# Patient Record
Sex: Female | Born: 1945 | Race: White | Hispanic: No | Marital: Married | Smoking: Never smoker
Health system: Southern US, Community
[De-identification: ages and names within clinical notes are randomized; demographics above are authoritative.]

## PROBLEM LIST (undated history)

## (undated) DIAGNOSIS — E785 Hyperlipidemia, unspecified: Secondary | ICD-10-CM

## (undated) DIAGNOSIS — J069 Acute upper respiratory infection, unspecified: Secondary | ICD-10-CM

## (undated) DIAGNOSIS — I10 Essential (primary) hypertension: Secondary | ICD-10-CM

## (undated) HISTORY — PX: TUBAL LIGATION: SHX77

## (undated) HISTORY — DX: Essential (primary) hypertension: I10

## (undated) HISTORY — PX: COLPOSCOPY: SHX161

## (undated) HISTORY — DX: Hyperlipidemia, unspecified: E78.5

---

## 1999-02-15 ENCOUNTER — Other Ambulatory Visit: Admission: RE | Admit: 1999-02-15 | Discharge: 1999-02-15 | Payer: Self-pay | Admitting: Family Medicine

## 2000-05-02 ENCOUNTER — Other Ambulatory Visit: Admission: RE | Admit: 2000-05-02 | Discharge: 2000-05-02 | Payer: Self-pay | Admitting: Family Medicine

## 2002-10-23 ENCOUNTER — Other Ambulatory Visit: Admission: RE | Admit: 2002-10-23 | Discharge: 2002-10-23 | Payer: Self-pay | Admitting: Family Medicine

## 2003-12-17 ENCOUNTER — Other Ambulatory Visit: Admission: RE | Admit: 2003-12-17 | Discharge: 2003-12-17 | Payer: Self-pay | Admitting: Family Medicine

## 2005-05-16 ENCOUNTER — Other Ambulatory Visit: Admission: RE | Admit: 2005-05-16 | Discharge: 2005-05-16 | Payer: Self-pay | Admitting: Family Medicine

## 2006-07-03 ENCOUNTER — Other Ambulatory Visit: Admission: RE | Admit: 2006-07-03 | Discharge: 2006-07-03 | Payer: Self-pay | Admitting: Family Medicine

## 2010-08-09 ENCOUNTER — Encounter (INDEPENDENT_AMBULATORY_CARE_PROVIDER_SITE_OTHER): Payer: Self-pay | Admitting: *Deleted

## 2010-08-11 ENCOUNTER — Encounter (INDEPENDENT_AMBULATORY_CARE_PROVIDER_SITE_OTHER): Payer: Self-pay | Admitting: *Deleted

## 2010-09-09 ENCOUNTER — Encounter (INDEPENDENT_AMBULATORY_CARE_PROVIDER_SITE_OTHER): Payer: Self-pay | Admitting: *Deleted

## 2010-09-12 ENCOUNTER — Ambulatory Visit
Admission: RE | Admit: 2010-09-12 | Discharge: 2010-09-12 | Payer: Self-pay | Source: Home / Self Care | Attending: Gastroenterology | Admitting: Gastroenterology

## 2010-09-27 ENCOUNTER — Encounter: Payer: Self-pay | Admitting: Gastroenterology

## 2010-09-27 ENCOUNTER — Ambulatory Visit
Admission: RE | Admit: 2010-09-27 | Discharge: 2010-09-27 | Payer: Self-pay | Source: Home / Self Care | Attending: Gastroenterology | Admitting: Gastroenterology

## 2010-09-29 NOTE — Letter (Signed)
Summary: Pre Visit Letter Revised  [facility]  [address] [place], [place] [address]   Phone: [phone]  Fax: [phone]        08/09/2010 MRN: [id number] Carla Fleming [address] [place], [place]  [address]             Procedure Date:  09-19-10   Welcome to the Gastroenterology Division at [facility].    You are scheduled to see a nurse for your pre-procedure visit on 09-05-10 at 2:00P.M. on the 3rd floor at [facility], [address]  We ask that you try to arrive at our office 15 minutes prior to your appointment time to allow for check-in.  Please take a minute to review the attached form.  If you answer "Yes" to one or more of the questions on the first page, we ask that you call the person listed at your earliest opportunity.  If you answer "No" to all of the questions, please complete the rest of the form and bring it to your appointment.    Your nurse visit will consist of discussing your medical and surgical history, your immediate family medical history, and your medications.   If you are unable to list all of your medications on the form, please bring the medication bottles to your appointment and we will list them.  We will need to be aware of both prescribed and over the counter drugs.  We will need to know exact dosage information as well.    Please be prepared to read and sign documents such as consent forms, a financial agreement, and acknowledgement forms.  If necessary, and with your consent, a friend or relative is welcome to sit-in on the nurse visit with you.  Please bring your insurance card so that we may make a copy of it.  If your insurance requires a referral to see a specialist, please bring your referral form from your primary care physician.  No co-pay is required for this nurse visit.     If you cannot keep your appointment, please call [phone] to cancel or reschedule prior to your appointment date.  This allows [place] the  opportunity to schedule an appointment for another patient in need of care.    Thank you for choosing [facility] for your medical needs.  We appreciate the opportunity to care for you.  Please visit [place] at our website  to learn more about our practice.  Sincerely, The Gastroenterology Division

## 2010-09-29 NOTE — Letter (Signed)
Summary: Pre Visit Letter Revised  [facility]  [address] [place], [place] [address]   Phone: [phone]  Fax: [phone]        08/11/2010 MRN: [id number]   Carla Fleming [address] [place], [place]  [address]   Procedure Date: 09/27/10 @ 10:00 A.M.   Welcome to the Gastroenterology Division at [facility].    You are scheduled to see a nurse for your pre-procedure visit on 09/12/10 at 8:30 A.M. on the 3rd floor at [facility], [address].  We ask that you try to arrive at our office 15 minutes prior to your appointment time to allow for check-in.  Please take a minute to review the attached form.  If you answer "Yes" to one or more of the questions on the first page, we ask that you call the person listed at your earliest opportunity.  If you answer "No" to all of the questions, please complete the rest of the form and bring it to your appointment.    Your nurse visit will consist of discussing your medical and surgical history, your immediate family medical history, and your medications.   If you are unable to list all of your medications on the form, please bring the medication bottles to your appointment and we will list them.  We will need to be aware of both prescribed and over the counter drugs.  We will need to know exact dosage information as well.    Please be prepared to read and sign documents such as consent forms, a financial agreement, and acknowledgement forms.  If necessary, and with your consent, a friend or relative is welcome to sit-in on the nurse visit with you.  Please bring your insurance card so that we may make a copy of it.  If your insurance requires a referral to see a specialist, please bring your referral form from your primary care physician.  No co-pay is required for this nurse visit.     If you cannot keep your appointment, please call [phone] to cancel or reschedule prior to your appointment date.  This allows  [place] the opportunity to schedule an appointment for another patient in need of care.    Thank you for choosing [facility] for your medical needs.  We appreciate the opportunity to care for you.  Please visit [place] at our website  to learn more about our practice.  Sincerely, The Gastroenterology Division

## 2010-09-29 NOTE — Letter (Signed)
Summary: [facility] Instructions  [facility]  [address] [place], [place] [address]   Phone: [phone]  Fax: [phone]       Carla Fleming    [birth year]    MRN: [id number]        Procedure Day Dorna Bloom:  Jake Shark  09/27/10     Arrival Time:  9:00AM     Procedure Time:  10:00AM     Location of Procedure:                    Juliann Pares _  [facility] (4th Floor)                      PREPARATION FOR COLONOSCOPY WITH MOVIPREP   Starting 5 days prior to your procedure 09/22/10 do not eat nuts, seeds, popcorn, corn, beans, peas,  salads, or any raw vegetables.  Do not take any fiber supplements (e.g. Metamucil, Citrucel, and Benefiber).  THE DAY BEFORE YOUR PROCEDURE         DATE: 09/26/10   DAY: MONDAY  1.  Drink clear liquids the entire day-NO SOLID FOOD  2.  Do not drink anything colored red or purple.  Avoid juices with pulp.  No orange juice.  3.  Drink at least 64 oz. (8 glasses) of fluid/clear liquids during the day to prevent dehydration and help the prep work efficiently.  CLEAR LIQUIDS INCLUDE: Water Jello Ice Popsicles Tea (sugar ok, no milk/cream) Powdered fruit flavored drinks Coffee (sugar ok, no milk/cream) Gatorade Juice: apple, white grape, white cranberry  Lemonade Clear bullion, consomm, broth Carbonated beverages (any kind) Strained chicken noodle soup Hard Candy                             4.  In the morning, mix first dose of MoviPrep solution:    Empty 1 Pouch A and 1 Pouch B into the disposable container    Add lukewarm drinking water to the top line of the container. Mix to dissolve    Refrigerate (mixed solution should be used within 24 hrs)  5.  Begin drinking the prep at 5:00 p.m. The MoviPrep container is divided by 4 marks.   Every 15 minutes drink the solution down to the next mark (approximately 8 oz) until the full liter is complete.   6.  Follow completed prep with 16 oz of clear liquid of your choice (Nothing  red or purple).  Continue to drink clear liquids until bedtime.  7.  Before going to bed, mix second dose of MoviPrep solution:    Empty 1 Pouch A and 1 Pouch B into the disposable container    Add lukewarm drinking water to the top line of the container. Mix to dissolve    Refrigerate  THE DAY OF YOUR PROCEDURE      DATE: 09/27/10  DAY: TUESDAY  Beginning at 5:00AM (5 hours before procedure):         1. Every 15 minutes, drink the solution down to the next mark (approx 8 oz) until the full liter is complete.  2. Follow completed prep with 16 oz. of clear liquid of your choice.    3. You may drink clear liquids until 8:00AM (2 HOURS BEFORE PROCEDURE).   MEDICATION INSTRUCTIONS  Unless otherwise instructed, you should take regular prescription medications with a small sip of water   as early as possible the morning of  your procedure.         OTHER INSTRUCTIONS  You will need a responsible adult at least 65 years of age to accompany you and drive you home.   This person must remain in the waiting room during your procedure.  Wear loose fitting clothing that is easily removed.  Leave jewelry and other valuables at home.  However, you may wish to bring a book to read or  an iPod/MP3 player to listen to music as you wait for your procedure to start.  Remove all body piercing jewelry and leave at home.  Total time from sign-in until discharge is approximately 2-3 hours.  You should go home directly after your procedure and rest.  You can resume normal activities the  day after your procedure.  The day of your procedure you should not:   Drive   Make legal decisions   Operate machinery   Drink alcohol   Return to work  You will receive specific instructions about eating, activities and medications before you leave.    The above instructions have been reviewed and explained to me by   Karl Bales RN  September 12, 2010 8:40 AM    I fully understand and  can verbalize these instructions _____________________________ Date _________

## 2010-09-29 NOTE — Miscellaneous (Signed)
Summary: [facility] previsit  Clinical Lists Changes  Medications: Added new medication of MOVIPREP 100 GM  SOLR (PEG-KCL-NACL-NASULF-NA ASC-C) As per prep instructions. - Signed Rx of MOVIPREP 100 GM  SOLR (PEG-KCL-NACL-NASULF-NA ASC-C) As per prep instructions.;  #1 x 0;  Signed;  Entered by: Karl Bales RN;  Authorized by: Louis Meckel MD;  Method used: Print then Give to Patient Observations: Added new observation of NKA: T (09/12/2010 7:53)    Prescriptions: MOVIPREP 100 GM  SOLR (PEG-KCL-NACL-NASULF-NA ASC-C) As per prep instructions.  #1 x 0   Entered by:   Karl Bales RN   Authorized by:   Louis Meckel MD   Signed by:   Karl Bales RN on 09/12/2010   Method used:   Print then Give to Patient   RxID:   [id number]

## 2010-10-05 NOTE — Procedures (Signed)
Summary: Colonoscopy  Patient: Carla Fleming Note: All result statuses are Final unless otherwise noted.  Tests: (1) Colonoscopy (COL)   COL Colonoscopy           DONE     [facility]     [address]     [place], [place]  [address]           COLONOSCOPY PROCEDURE REPORT           PATIENT:  Carla Fleming, Carla Fleming  MR#:  [id number]     BIRTHDATE:  29-Apr-1946, 64 yrs. old  GENDER:  female           ENDOSCOPIST:  Barbette Hair. Arlyce Dice, MD     Referred by:  Wardell Heath, N.P.           PROCEDURE DATE:  09/27/2010     PROCEDURE:  Diagnostic Colonoscopy     ASA CLASS:  Class I     INDICATIONS:  1) Routine Risk Screening           MEDICATIONS:   Fentanyl 75 mcg IV, Versed 8 mg IV, Benadryl 12.5     mg IV           DESCRIPTION OF PROCEDURE:   After the risks benefits and     alternatives of the procedure were thoroughly explained, informed     consent was obtained.  Digital rectal exam was performed and     revealed no abnormalities.   The LB [id number]-[id number] endoscope     was introduced through the anus and advanced to the cecum, which     was identified by both the appendix and ileocecal valve, without     limitations.  The quality of the prep was excellent, using     MoviPrep.  The instrument was then slowly withdrawn as the colon     was fully examined.     <<PROCEDUREIMAGES>>           FINDINGS:  A normal appearing cecum, ileocecal valve, and     appendiceal orifice were identified. The ascending, hepatic     flexure, transverse, splenic flexure, descending, sigmoid colon,     and rectum appeared unremarkable (see image2, image3, image4,     image6, image7, image9, image12, and image13).   Retroflexed views     in the rectum revealed no abnormalities.    The time to cecum =     3.75  minutes. The scope was then withdrawn (time =  7.50  min)     from the patient and the procedure completed.           COMPLICATIONS:  None           ENDOSCOPIC IMPRESSION:     1) Normal  colon     RECOMMENDATIONS:     1) Continue current colorectal screening recommendations for     "routine risk" patients with a repeat colonoscopy in 10 years.           REPEAT EXAM:   10 year(s) Colonoscopy           ______________________________     Barbette Hair. Arlyce Dice, MD           CC:           n.     eSIGNED:   Barbette Hair. Tifanie Gardiner at 09/27/2010 10:09 AM           Berenda Morale, [id number]  Note: An exclamation mark (!) indicates a  result that was not dispersed into the flowsheet. Document Creation Date: 09/27/2010 10:09 AM _______________________________________________________________________  (1) Order result status: Final Collection or observation date-time: 09/27/2010 10:02 Requested date-time:  Receipt date-time:  Reported date-time:  Referring Physician:   Ordering Physician: Melvia Heaps [phone]) Specimen Source:  Source: Launa Grill Order Number: [id number] Lab site:   Appended Document: Colonoscopy    Clinical Lists Changes  Observations: Added new observation of COLONNXTDUE: 08/2020 (09/27/2010 10:57)

## 2011-06-02 ENCOUNTER — Encounter (INDEPENDENT_AMBULATORY_CARE_PROVIDER_SITE_OTHER): Payer: Self-pay | Admitting: Surgery

## 2011-06-05 ENCOUNTER — Ambulatory Visit (INDEPENDENT_AMBULATORY_CARE_PROVIDER_SITE_OTHER): Payer: Self-pay | Admitting: Surgery

## 2011-06-09 ENCOUNTER — Encounter (INDEPENDENT_AMBULATORY_CARE_PROVIDER_SITE_OTHER): Payer: Self-pay | Admitting: Surgery

## 2011-06-09 ENCOUNTER — Ambulatory Visit (INDEPENDENT_AMBULATORY_CARE_PROVIDER_SITE_OTHER): Payer: BC Managed Care – PPO | Admitting: Surgery

## 2011-06-09 VITALS — BP 158/103 | HR 73 | Ht 63.0 in | Wt 157.0 lb

## 2011-06-09 DIAGNOSIS — K8010 Calculus of gallbladder with chronic cholecystitis without obstruction: Secondary | ICD-10-CM | POA: Insufficient documentation

## 2011-06-09 NOTE — Progress Notes (Signed)
Chief Complaint  Patient presents with  . Abdominal Pain    gallbladder    HPI Carla Fleming is a 65 y.o. female.  She presents in referral from Dr. Rudi Fleming for evaluation of gallstones. The patient had a routine chest x-ray 2 years ago and was noted to have gallstones at that time. She was asymptomatic. Over the last several months she has been experiencing some postprandial nausea generalized abdominal pain, heartburn symptoms, and some back pain. She states that this tends to happen after eating greasy meals. She has changed her diet significantly. She has been started on Prilosec which has helped slightly. The patient states that when she has these episodes she tends to have multiple bowel movements progressing towards diarrhea. Normally she has a solid bowel movement every other day. She also has noted some abdominal bloating with these episodes. She was evaluated by Dr. Christell Fleming who obtained an abdominal ultrasound on 05/22/11 at [facility]. This showed multiple gallstones but no gallbladder wall thickening. Common bile duct was normal. She is now referred for surgical evaluation. HPI  Past Medical History  Diagnosis Date  . Hypertension   . Hyperlipidemia     Past Surgical History  Procedure Date  . Colposcopy 30+ years ago    Family History  Problem Relation Age of Onset  . Hypertension Mother     Social History History  Substance Use Topics  . Smoking status: Never Smoker   . Smokeless tobacco: Not on file  . Alcohol Use: Yes     occasionally    No Known Allergies  Current Outpatient Prescriptions  Medication Sig Dispense Refill  . fosinopril-hydrochlorothiazide (MONOPRIL-HCT) 20-12.5 MG per tablet Take 1 tablet by mouth daily.        . pravastatin (PRAVACHOL) 40 MG tablet Take 40 mg by mouth daily.          Review of Systems Review of Systems ROS positive for abdominal pain, bloating, diarrhea, nausea, otherwise negative Blood pressure 158/103, pulse  73, height 5\' 3"  (1.6 m), weight 157 lb (71.215 kg).  Physical Exam Physical Exam WDWN in NAD HEENT:  EOMI, sclera anicteric Neck:  No masses, no thyromegaly Lungs:  CTA bilaterally; normal respiratory effort CV:  Regular rate and rhythm; no murmurs Abd:  +bowel sounds, soft, non-tender, no masses Ext:  Well-perfused; no edema Skin:  Warm, dry; no sign of jaundice  Data Reviewed Abdominal U/S - multiple gallstones, no sign of wall thickening or pericholecystic fluid; nl CBD Assessment    Chronic calculus cholecystitis with biliary colic    Plan    Laparoscopic cholecystectomy with intraoperative cholangiogram.  I discussed the procedure in detail.  The patient was given Agricultural engineer.  We discussed the risks and benefits of a laparoscopic cholecystectomy including, but not limited to bleeding, infection, injury to surrounding structures such as the intestine or liver, bile leak, retained gallstones, need to convert to an open procedure, prolonged diarrhea, blood clots such as  DVT, common bile duct injury, anesthesia risks, and possible need for additional procedures.  The likelihood of improvement in symptoms and return to the patient's normal status is good. We discussed the typical post-operative recovery course.        Carla Fleming K. 06/09/2011, 9:52 AM

## 2011-06-09 NOTE — Patient Instructions (Signed)
Our surgery schedulers will call you to schedule the surgery.  You will need to be off of work for about a week.

## 2011-06-26 ENCOUNTER — Encounter (INDEPENDENT_AMBULATORY_CARE_PROVIDER_SITE_OTHER): Payer: Self-pay

## 2011-06-28 ENCOUNTER — Encounter (HOSPITAL_COMMUNITY): Payer: Self-pay

## 2011-07-03 ENCOUNTER — Encounter (HOSPITAL_COMMUNITY)
Admission: RE | Admit: 2011-07-03 | Discharge: 2011-07-03 | Disposition: A | Discharge status: Home / Self Care | Payer: BC Managed Care – PPO | Source: Ambulatory Visit | Attending: Surgery | Admitting: Surgery

## 2011-07-03 ENCOUNTER — Other Ambulatory Visit: Payer: Self-pay

## 2011-07-03 ENCOUNTER — Encounter (HOSPITAL_COMMUNITY): Payer: Self-pay

## 2011-07-03 HISTORY — DX: Acute upper respiratory infection, unspecified: J06.9

## 2011-07-03 LAB — DIFFERENTIAL
Basophils Relative: 1 % (ref 0–1)
Monocytes Absolute: 0.5 10*3/uL (ref 0.1–1.0)
Monocytes Relative: 7 % (ref 3–12)
Neutro Abs: 4.2 10*3/uL (ref 1.7–7.7)

## 2011-07-03 LAB — CBC
Platelets: 276 10*3/uL (ref 150–400)
RDW: 13.1 % (ref 11.5–15.5)
WBC: 8 10*3/uL (ref 4.0–10.5)

## 2011-07-03 LAB — COMPREHENSIVE METABOLIC PANEL
ALT: 20 U/L (ref 0–35)
AST: 22 U/L (ref 0–37)
Albumin: 4 g/dL (ref 3.5–5.2)
Alkaline Phosphatase: 128 U/L — ABNORMAL HIGH (ref 39–117)
Chloride: 104 mEq/L (ref 96–112)
Creatinine, Ser: 0.67 mg/dL (ref 0.50–1.10)
Potassium: 3.9 mEq/L (ref 3.5–5.1)
Sodium: 142 mEq/L (ref 135–145)
Total Bilirubin: 0.6 mg/dL (ref 0.3–1.2)

## 2011-07-03 MED ORDER — CEFAZOLIN SODIUM 1-5 GM-% IV SOLN
1.0000 g | INTRAVENOUS | Status: DC
  Filled 2011-07-03: qty 50

## 2011-07-03 NOTE — Progress Notes (Signed)
Quick Note:  This patient may proceed with surgery ______ 

## 2011-07-04 ENCOUNTER — Encounter (HOSPITAL_COMMUNITY): Payer: Self-pay | Admitting: *Deleted

## 2011-07-04 ENCOUNTER — Encounter (HOSPITAL_COMMUNITY): Payer: Self-pay | Admitting: Anesthesiology

## 2011-07-04 ENCOUNTER — Ambulatory Visit (HOSPITAL_COMMUNITY)
Admission: RE | Admit: 2011-07-04 | Discharge: 2011-07-04 | Disposition: A | Discharge status: Home / Self Care | Payer: BC Managed Care – PPO | Source: Ambulatory Visit | Attending: Surgery | Admitting: Surgery

## 2011-07-04 ENCOUNTER — Other Ambulatory Visit (HOSPITAL_COMMUNITY): Payer: Self-pay | Admitting: Anesthesiology

## 2011-07-04 ENCOUNTER — Other Ambulatory Visit (INDEPENDENT_AMBULATORY_CARE_PROVIDER_SITE_OTHER): Payer: Self-pay | Admitting: Surgery

## 2011-07-04 ENCOUNTER — Encounter (HOSPITAL_COMMUNITY)
Admission: RE | Disposition: A | Discharge status: Home / Self Care | Payer: Self-pay | Source: Ambulatory Visit | Attending: Surgery

## 2011-07-04 ENCOUNTER — Ambulatory Visit (HOSPITAL_COMMUNITY): Payer: BC Managed Care – PPO | Admitting: Anesthesiology

## 2011-07-04 ENCOUNTER — Ambulatory Visit (HOSPITAL_COMMUNITY): Payer: BC Managed Care – PPO

## 2011-07-04 DIAGNOSIS — Z01812 Encounter for preprocedural laboratory examination: Secondary | ICD-10-CM | POA: Insufficient documentation

## 2011-07-04 DIAGNOSIS — Z01818 Encounter for other preprocedural examination: Secondary | ICD-10-CM | POA: Insufficient documentation

## 2011-07-04 DIAGNOSIS — Z01810 Encounter for preprocedural cardiovascular examination: Secondary | ICD-10-CM | POA: Insufficient documentation

## 2011-07-04 DIAGNOSIS — K8010 Calculus of gallbladder with chronic cholecystitis without obstruction: Secondary | ICD-10-CM | POA: Insufficient documentation

## 2011-07-04 DIAGNOSIS — I10 Essential (primary) hypertension: Secondary | ICD-10-CM | POA: Insufficient documentation

## 2011-07-04 HISTORY — PX: CHOLECYSTECTOMY: SHX55

## 2011-07-04 SURGERY — LAPAROSCOPIC CHOLECYSTECTOMY WITH INTRAOPERATIVE CHOLANGIOGRAM
Anesthesia: General | Site: Abdomen | Wound class: Contaminated

## 2011-07-04 MED ORDER — CEFAZOLIN SODIUM 1-5 GM-% IV SOLN
INTRAVENOUS | Status: DC | PRN
  Administered 2011-07-04: 1 g via INTRAVENOUS

## 2011-07-04 MED ORDER — OXYCODONE-ACETAMINOPHEN 5-325 MG PO TABS
1.0000 | ORAL_TABLET | ORAL | Status: DC | PRN
  Filled 2011-07-04: qty 1

## 2011-07-04 MED ORDER — ONDANSETRON HCL 4 MG/2ML IJ SOLN
INTRAMUSCULAR | Status: DC | PRN
  Administered 2011-07-04: 4 mg via INTRAVENOUS

## 2011-07-04 MED ORDER — HYDROMORPHONE HCL PF 1 MG/ML IJ SOLN
0.2500 mg | INTRAMUSCULAR | Status: DC | PRN
  Administered 2011-07-04 (×2): 0.25 mg via INTRAVENOUS

## 2011-07-04 MED ORDER — LACTATED RINGERS IV SOLN
INTRAVENOUS | Status: DC
  Administered 2011-07-04: 10:00:00 via INTRAVENOUS

## 2011-07-04 MED ORDER — BUPIVACAINE-EPINEPHRINE 0.25% -1:200000 IJ SOLN
INTRAMUSCULAR | Status: DC | PRN
  Administered 2011-07-04: 10 mL

## 2011-07-04 MED ORDER — LACTATED RINGERS IV SOLN
INTRAVENOUS | Status: DC | PRN
  Administered 2011-07-04 (×2): via INTRAVENOUS

## 2011-07-04 MED ORDER — ROCURONIUM BROMIDE 100 MG/10ML IV SOLN
INTRAVENOUS | Status: DC | PRN
  Administered 2011-07-04: 40 mg via INTRAVENOUS

## 2011-07-04 MED ORDER — OXYCODONE-ACETAMINOPHEN 5-325 MG PO TABS: 1.0000 | ORAL_TABLET | ORAL | Status: AC | PRN

## 2011-07-04 MED ORDER — SODIUM CHLORIDE 0.9 % IJ SOLN: 3.0000 mL | INTRAMUSCULAR | Status: DC | PRN

## 2011-07-04 MED ORDER — MIDAZOLAM HCL 5 MG/5ML IJ SOLN
INTRAMUSCULAR | Status: DC | PRN
  Administered 2011-07-04: 2 mg via INTRAVENOUS

## 2011-07-04 MED ORDER — ONDANSETRON HCL 4 MG/2ML IJ SOLN
4.0000 mg | INTRAMUSCULAR | Status: DC | PRN
  Administered 2011-07-04: 4 mg via INTRAVENOUS
  Filled 2011-07-04: qty 2

## 2011-07-04 MED ORDER — FENTANYL CITRATE 0.05 MG/ML IJ SOLN
INTRAMUSCULAR | Status: DC | PRN
  Administered 2011-07-04: 100 ug via INTRAVENOUS
  Administered 2011-07-04: 150 ug via INTRAVENOUS
  Administered 2011-07-04: 100 ug via INTRAVENOUS

## 2011-07-04 MED ORDER — SODIUM CHLORIDE 0.9 % IV SOLN
12.5000 mg | Freq: Once | INTRAVENOUS | Status: DC
  Filled 2011-07-04: qty 0.5

## 2011-07-04 MED ORDER — SODIUM CHLORIDE 0.9 % IR SOLN
Status: DC | PRN
  Administered 2011-07-04: 700 mL

## 2011-07-04 MED ORDER — MIDAZOLAM HCL 2 MG/2ML IJ SOLN: 1.0000 mg | INTRAMUSCULAR | Status: DC | PRN

## 2011-07-04 MED ORDER — PROPOFOL 10 MG/ML IV EMUL
INTRAVENOUS | Status: DC | PRN
  Administered 2011-07-04: 200 mg via INTRAVENOUS

## 2011-07-04 MED ORDER — NEOSTIGMINE METHYLSULFATE 1 MG/ML IJ SOLN
INTRAMUSCULAR | Status: DC | PRN
  Administered 2011-07-04: 5 mg via INTRAVENOUS

## 2011-07-04 MED ORDER — MORPHINE SULFATE 2 MG/ML IJ SOLN
2.0000 mg | INTRAMUSCULAR | Status: DC | PRN
  Filled 2011-07-04: qty 2

## 2011-07-04 MED ORDER — GLYCOPYRROLATE 0.2 MG/ML IJ SOLN
INTRAMUSCULAR | Status: DC | PRN
  Administered 2011-07-04: .7 mg via INTRAVENOUS

## 2011-07-04 MED ORDER — IOHEXOL 300 MG/ML  SOLN
INTRAMUSCULAR | Status: DC | PRN
  Administered 2011-07-04: 10 mL

## 2011-07-04 MED ORDER — LACTATED RINGERS IV SOLN: INTRAVENOUS | Status: DC

## 2011-07-04 MED ORDER — FENTANYL CITRATE 0.05 MG/ML IJ SOLN: 50.0000 ug | INTRAMUSCULAR | Status: DC | PRN

## 2011-07-04 SURGICAL SUPPLY — 51 items
APL SKNCLS STERI-STRIP NONHPOA (GAUZE/BANDAGES/DRESSINGS) ×1
APPLIER CLIP ROT 10 11.4 M/L (STAPLE) ×2
APR CLP MED LRG 11.4X10 (STAPLE) ×1
BAG SPEC RTRVL LRG 6X4 10 (ENDOMECHANICALS) ×1
BENZOIN TINCTURE PRP APPL 2/3 (GAUZE/BANDAGES/DRESSINGS) ×2 IMPLANT
BLADE SURG ROTATE 9660 (MISCELLANEOUS) IMPLANT
CANISTER SUCTION 2500CC (MISCELLANEOUS) ×2 IMPLANT
CHLORAPREP W/TINT 26ML (MISCELLANEOUS) ×2 IMPLANT
CLIP APPLIE ROT 10 11.4 M/L (STAPLE) ×1 IMPLANT
CLOTH BEACON ORANGE TIMEOUT ST (SAFETY) ×2 IMPLANT
COVER MAYO STAND STRL (DRAPES) IMPLANT
COVER SURGICAL LIGHT HANDLE (MISCELLANEOUS) ×2 IMPLANT
DECANTER SPIKE VIAL GLASS SM (MISCELLANEOUS) ×2 IMPLANT
DRAPE C-ARM 42X72 X-RAY (DRAPES) IMPLANT
DRAPE UTILITY 15X26 W/TAPE STR (DRAPE) ×4 IMPLANT
DRSG TEGADERM 4X4.75 (GAUZE/BANDAGES/DRESSINGS) ×2 IMPLANT
ELECT REM PT RETURN 9FT ADLT (ELECTROSURGICAL) ×2
ELECTRODE REM PT RTRN 9FT ADLT (ELECTROSURGICAL) ×1 IMPLANT
FILTER SMOKE EVAC LAPAROSHD (FILTER) ×2 IMPLANT
GAUZE SPONGE 2X2 8PLY STRL LF (GAUZE/BANDAGES/DRESSINGS) ×1 IMPLANT
GLOVE BIO SURGEON STRL SZ7 (GLOVE) ×2 IMPLANT
GLOVE BIOGEL PI IND STRL 7.0 (GLOVE) IMPLANT
GLOVE BIOGEL PI IND STRL 7.5 (GLOVE) ×1 IMPLANT
GLOVE BIOGEL PI INDICATOR 7.0 (GLOVE) ×2
GLOVE BIOGEL PI INDICATOR 7.5 (GLOVE) ×1
GLOVE SS BIOGEL STRL SZ 6.5 (GLOVE) IMPLANT
GLOVE SUPERSENSE BIOGEL SZ 6.5 (GLOVE) ×1
GLOVE SURG SS PI 6.5 STRL IVOR (GLOVE) ×1 IMPLANT
GOWN STRL NON-REIN LRG LVL3 (GOWN DISPOSABLE) ×5 IMPLANT
KIT BASIN OR (CUSTOM PROCEDURE TRAY) ×2 IMPLANT
KIT ROOM TURNOVER OR (KITS) ×2 IMPLANT
NS IRRIG 1000ML POUR BTL (IV SOLUTION) ×2 IMPLANT
PAD ARMBOARD 7.5X6 YLW CONV (MISCELLANEOUS) ×2 IMPLANT
POUCH SPECIMEN RETRIEVAL 10MM (ENDOMECHANICALS) ×2 IMPLANT
SCISSORS LAP 5X35 DISP (ENDOMECHANICALS) IMPLANT
SET CHOLANGIOGRAPH 5 50 .035 (SET/KITS/TRAYS/PACK) IMPLANT
SET IRRIG TUBING LAPAROSCOPIC (IRRIGATION / IRRIGATOR) ×2 IMPLANT
SLEEVE ENDOPATH XCEL 5M (ENDOMECHANICALS) ×2 IMPLANT
SPECIMEN JAR SMALL (MISCELLANEOUS) ×2 IMPLANT
SPONGE GAUZE 2X2 STER 10/PKG (GAUZE/BANDAGES/DRESSINGS) ×1
SPONGE GAUZE 4X4 12PLY (GAUZE/BANDAGES/DRESSINGS) ×1 IMPLANT
STRIP CLOSURE SKIN 1/2X4 (GAUZE/BANDAGES/DRESSINGS) ×1 IMPLANT
SUT MNCRL AB 4-0 PS2 18 (SUTURE) ×2 IMPLANT
TAPE CLOTH SURG 4X10 WHT LF (GAUZE/BANDAGES/DRESSINGS) ×1 IMPLANT
TOWEL OR 17X24 6PK STRL BLUE (TOWEL DISPOSABLE) ×2 IMPLANT
TOWEL OR 17X26 10 PK STRL BLUE (TOWEL DISPOSABLE) ×2 IMPLANT
TRAY LAPAROSCOPIC (CUSTOM PROCEDURE TRAY) ×2 IMPLANT
TROCAR XCEL BLUNT TIP 100MML (ENDOMECHANICALS) ×2 IMPLANT
TROCAR XCEL NON-BLD 11X100MML (ENDOMECHANICALS) ×2 IMPLANT
TROCAR XCEL NON-BLD 5MMX100MML (ENDOMECHANICALS) ×2 IMPLANT
WATER STERILE IRR 1000ML POUR (IV SOLUTION) IMPLANT

## 2011-07-04 NOTE — Preoperative (Signed)
Beta Blockers   Reason not to administer Beta Blockers:Not Applicable 

## 2011-07-04 NOTE — Anesthesia Preprocedure Evaluation (Addendum)
Anesthesia Evaluation  Patient identified by MRN, date of birth, ID band Patient awake    Reviewed: Allergy & Precautions, H&P , NPO status   Airway Mallampati: I TM Distance: >3 FB Neck ROM: Full    Dental  (+) Teeth Intact, Caps and Dental Advisory Given   Pulmonary Recent URI , Resolved,          Cardiovascular hypertension, Pt. on medications     Neuro/Psych    GI/Hepatic   Endo/Other    Renal/GU      Musculoskeletal   Abdominal   Peds  Hematology   Anesthesia Other Findings   Reproductive/Obstetrics                          Anesthesia Physical Anesthesia Plan  ASA: III  Anesthesia Plan: General   Post-op Pain Management:    Induction: Intravenous  Airway Management Planned: Oral ETT  Additional Equipment:   Intra-op Plan:   Post-operative Plan: Extubation in OR  Informed Consent: I have reviewed the patients History and Physical, chart, labs and discussed the procedure including the risks, benefits and alternatives for the proposed anesthesia with the patient or authorized representative who has indicated his/her understanding and acceptance.   Dental advisory given  Plan Discussed with: CRNA  Anesthesia Plan Comments:         Anesthesia Quick Evaluation

## 2011-07-04 NOTE — Op Note (Signed)
Laparoscopic Cholecystectomy with IOC Procedure Note  Indications: This patient presents with symptomatic gallbladder disease and will undergo laparoscopic cholecystectomy.  Pre-operative Diagnosis: Chronic cholecystitis  Post-operative Diagnosis: Same  Surgeon: Kona Lover K.   Assistants: none  Anesthesia: General endotracheal anesthesia  ASA Class: 2  Procedure Details  The patient was seen again in the Holding Room. The risks, benefits, complications, treatment options, and expected outcomes were discussed with the patient. The possibilities of reaction to medication, pulmonary aspiration, perforation of viscus, bleeding, recurrent infection, finding a normal gallbladder, the need for additional procedures, failure to diagnose a condition, the possible need to convert to an open procedure, and creating a complication requiring transfusion or operation were discussed with the patient. The likelihood of improving the patient's symptoms with return to their baseline status is good.  The patient and/or family concurred with the proposed plan, giving informed consent. The site of surgery properly noted. The patient was taken to Operating Room, identified as Carla Fleming and the procedure verified as Laparoscopic Cholecystectomy with Intraoperative Cholangiogram. A Time Out was held and the above information confirmed.  Prior to the induction of general anesthesia, antibiotic prophylaxis was administered. General endotracheal anesthesia was then administered and tolerated well. After the induction, the abdomen was prepped with Chloraprep and draped in the sterile fashion. The patient was positioned in the supine position.  Local anesthetic agent was injected into the skin near the umbilicus and an incision made. We dissected down to the abdominal fascia with blunt dissection.  The fascia was incised vertically and we entered the peritoneal cavity bluntly.  A pursestring suture of 0-Vicryl was  placed around the fascial opening.  The Hasson cannula was inserted and secured with the stay suture.  Pneumoperitoneum was then created with CO2 and tolerated well without any adverse changes in the patient's vital signs. An 11-mm port was placed in the subxiphoid position.  Two 5-mm ports were placed in the right upper quadrant. All skin incisions were infiltrated with a local anesthetic agent before making the incision and placing the trocars.   We positioned the patient in reverse Trendelenburg, tilted slightly to the patient's left.  The gallbladder was identified, the fundus grasped and retracted cephalad. Adhesions were lysed bluntly and with the electrocautery where indicated, taking care not to injure any adjacent organs or viscus. The infundibulum was grasped and retracted laterally, exposing the peritoneum overlying the triangle of Calot. This was then divided and exposed in a blunt fashion. A critical view of the cystic duct and cystic artery was obtained.  The cystic duct was clearly identified and bluntly dissected circumferentially. The cystic duct was ligated with a clip distally.   An incision was made in the cystic duct and the [facility] cholangiogram catheter introduced. The catheter was secured using a clip. A cholangiogram was then obtained which showed good visualization of the distal and proximal biliary tree with no sign of filling defects or obstruction.  Contrast flowed easily into the duodenum. The catheter was then removed.   The cystic duct was then ligated with clips and divided. The cystic artery was identified, dissected free, ligated with clips and divided as well.   The gallbladder was dissected from the liver bed in retrograde fashion with the electrocautery. The gallbladder was removed and placed in an Endocatch sac. The liver bed was irrigated and inspected. Hemostasis was achieved with the electrocautery. Copious irrigation was utilized and was repeatedly aspirated until clear.   The gallbladder and Endocatch sac were then  removed through the umbilical port site.  The pursestring suture was used to close the umbilical fascia.    We again inspected the right upper quadrant for hemostasis.  Pneumoperitoneum was released as we removed the trocars.  4-0 Monocryl was used to close the skin.   Benzoin, steri-strips, and clean dressings were applied. The patient was then extubated and brought to the recovery room in stable condition. Instrument, sponge, and needle counts were correct at closure and at the conclusion of the case.   Findings: Cholecystitis with Cholelithiasis  Estimated Blood Loss: Minimal         Drains:          Specimens: Gallbladder           Complications: None; patient tolerated the procedure well.         Disposition: PACU - hemodynamically stable.         Condition: stable

## 2011-07-04 NOTE — Addendum Note (Signed)
Addendum  created 07/04/11 1452 by Judie Petit, MD   Modules edited:Inpatient MAR, Orders, PRL Based Order Sets   Inpatient [facility] -- Order [id number] @ 07/04/11 1432; [facility] Admin - Signoff -- Order [id number] @ 07/04/11 1432; MAR Admin - Signoff -- Order [id number] @ 07/04/11 1345

## 2011-07-04 NOTE — Transfer of Care (Signed)
Immediate Anesthesia Transfer of Care Note  Patient: Carla Fleming  Procedure(s) Performed:  LAPAROSCOPIC CHOLECYSTECTOMY WITH INTRAOPERATIVE CHOLANGIOGRAM  Patient Location: PACU  Anesthesia Type: General  Level of Consciousness: awake, alert , patient cooperative and responds to stimulation  Airway & Oxygen Therapy: Patient Spontanous Breathing and Patient connected to nasal cannula oxygen  Post-op Assessment: Report given to PACU RN  Post vital signs: Reviewed and stable  Complications: No apparent anesthesia complications

## 2011-07-04 NOTE — Anesthesia Postprocedure Evaluation (Signed)
  Anesthesia Post-op Note  Patient: Carla Fleming  Procedure(s) Performed:  LAPAROSCOPIC CHOLECYSTECTOMY WITH INTRAOPERATIVE CHOLANGIOGRAM  Patient Location: PACU  Anesthesia Type: General  Level of Consciousness: awake  Airway and Oxygen Therapy: Patient Spontanous Breathing  Post-op Pain: mild  Post-op Assessment: Post-op Vital signs reviewed and Pain level controlled  Post-op Vital Signs: stable  Complications: No apparent anesthesia complications

## 2011-07-04 NOTE — Addendum Note (Signed)
Addendum  created 07/04/11 1345 by Judie Petit, MD   Modules edited:Orders

## 2011-07-04 NOTE — H&P (Signed)
Abdominal Pain     gallbladder   HPI  Carla Fleming is a 65 y.o. female. She presents in referral from Dr. Rudi Heap for evaluation of gallstones. The patient had a routine chest x-ray 2 years ago and was noted to have gallstones at that time. She was asymptomatic. Over the last several months she has been experiencing some postprandial nausea generalized abdominal pain, heartburn symptoms, and some back pain. She states that this tends to happen after eating greasy meals. She has changed her diet significantly. She has been started on Prilosec which has helped slightly. The patient states that when she has these episodes she tends to have multiple bowel movements progressing towards diarrhea. Normally she has a solid bowel movement every other day. She also has noted some abdominal bloating with these episodes. She was evaluated by Dr. Christell Constant who obtained an abdominal ultrasound on 05/22/11 at [facility]. This showed multiple gallstones but no gallbladder wall thickening. Common bile duct was normal. She is now referred for surgical evaluation.  HPI  Past Medical History   Diagnosis  Date   .  Hypertension    .  Hyperlipidemia     Past Surgical History   Procedure  Date   .  Colposcopy  30+ years ago    Family History   Problem  Relation  Age of Onset   .  Hypertension  Mother    Social History  History   Substance Use Topics   .  Smoking status:  Never Smoker   .  Smokeless tobacco:  Not on file   .  Alcohol Use:  Yes      occasionally   No Known Allergies  Current Outpatient Prescriptions   Medication  Sig  Dispense  Refill   .  fosinopril-hydrochlorothiazide (MONOPRIL-HCT) 20-12.5 MG per tablet  Take 1 tablet by mouth daily.     .  pravastatin (PRAVACHOL) 40 MG tablet  Take 40 mg by mouth daily.     Review of Systems  Review of Systems  ROS positive for abdominal pain, bloating, diarrhea, nausea, otherwise negative  Blood pressure 158/103, pulse 73, height 5\' 3"  (1.6 m),  weight 157 lb (71.215 kg).  Physical Exam  Physical Exam  WDWN in NAD  HEENT: EOMI, sclera anicteric  Neck: No masses, no thyromegaly  Lungs: CTA bilaterally; normal respiratory effort  CV: Regular rate and rhythm; no murmurs  Abd: +bowel sounds, soft, non-tender, no masses  Ext: Well-perfused; no edema  Skin: Warm, dry; no sign of jaundice  Data Reviewed  Abdominal U/S - multiple gallstones, no sign of wall thickening or pericholecystic fluid; nl CBD  Assessment  Chronic calculus cholecystitis with biliary colic  Plan  Laparoscopic cholecystectomy with intraoperative cholangiogram. I discussed the procedure in detail. The patient was given Agricultural engineer. We discussed the risks and benefits of a laparoscopic cholecystectomy including, but not limited to bleeding, infection, injury to surrounding structures such as the intestine or liver, bile leak, retained gallstones, need to convert to an open procedure, prolonged diarrhea, blood clots such as DVT, common bile duct injury, anesthesia risks, and possible need for additional procedures. The likelihood of improvement in symptoms and return to the patient's normal status is good. We discussed the typical post-operative recovery course.  [name] K.  06/09/2011, 9:52 AM

## 2011-07-04 NOTE — Interval H&P Note (Signed)
History and Physical Interval Note:   07/04/2011   6:45 AM   Carla Fleming  has presented today for surgery, with the diagnosis of chronic calculus cholecystitis  The various methods of treatment have been discussed with the patient and family. After consideration of risks, benefits and other options for treatment, the patient has consented to  Procedure(s): LAPAROSCOPIC CHOLECYSTECTOMY WITH INTRAOPERATIVE CHOLANGIOGRAM as a surgical intervention .  The patients' history has been reviewed, patient examined, no change in status, stable for surgery.  I have reviewed the patients' chart and labs.  Questions were answered to the patient's satisfaction.     Wynona Luna  MD   History and Physical Interval Note:   07/04/2011   6:45 AM   Carla Fleming  has presented today for surgery, with the diagnosis of chronic calculus cholecystitis  The various methods of treatment have been discussed with the patient and family. After consideration of risks, benefits and other options for treatment, the patient has consented to  Procedure(s): LAPAROSCOPIC CHOLECYSTECTOMY WITH INTRAOPERATIVE CHOLANGIOGRAM as a surgical intervention .  The patients' history has been reviewed, patient examined, no change in status, stable for surgery.  I have reviewed the patients' chart and labs.  Questions were answered to the patient's satisfaction.     Wynona Luna  MD

## 2011-07-08 ENCOUNTER — Encounter (HOSPITAL_COMMUNITY): Payer: Self-pay

## 2011-07-10 ENCOUNTER — Encounter (HOSPITAL_COMMUNITY): Payer: Self-pay | Admitting: Surgery

## 2011-07-27 ENCOUNTER — Encounter (INDEPENDENT_AMBULATORY_CARE_PROVIDER_SITE_OTHER): Payer: Self-pay | Admitting: Surgery

## 2011-07-27 ENCOUNTER — Ambulatory Visit (INDEPENDENT_AMBULATORY_CARE_PROVIDER_SITE_OTHER): Payer: BC Managed Care – PPO | Admitting: Surgery

## 2011-07-27 VITALS — BP 121/83 | HR 82 | Temp 98.6°F | Resp 16 | Ht 63.0 in | Wt 150.6 lb

## 2011-07-27 DIAGNOSIS — K8010 Calculus of gallbladder with chronic cholecystitis without obstruction: Secondary | ICD-10-CM

## 2011-07-27 NOTE — Progress Notes (Signed)
Filed Vitals:   07/27/11 0901  BP: 121/83  Pulse: 82  Temp: 98.6 F (37 C)  Resp: 16   This patient is status post laparoscopic cholecystectomy with intraoperative cholangiogram.  The pathology report showed chronic cholecystitis.  The patient reports that the post-operative pain has resolved.  They have resumed a regular diet without problems and are having regular bowel movements.  On physical examination, all of the incisions are healing well with no signs of infection or bleeding.  Steri-strips have all been removed.  Impression:  The patient is doing well after laparoscopic cholecystectomy for chronic cholecystitis.  Plan:  The patient may resume full activity and regular diet.  They may follow-up with [place] on a PRN basis.

## 2011-07-27 NOTE — Patient Instructions (Signed)
Resume regular diet and full activity

## 2013-06-10 ENCOUNTER — Encounter: Payer: Self-pay | Admitting: Nurse Practitioner

## 2013-06-10 ENCOUNTER — Encounter (INDEPENDENT_AMBULATORY_CARE_PROVIDER_SITE_OTHER): Payer: Self-pay

## 2013-06-10 ENCOUNTER — Ambulatory Visit (INDEPENDENT_AMBULATORY_CARE_PROVIDER_SITE_OTHER): Payer: BC Managed Care – PPO | Admitting: Nurse Practitioner

## 2013-06-10 VITALS — BP 158/98 | HR 74 | Temp 97.2°F | Ht 63.0 in | Wt 157.0 lb

## 2013-06-10 DIAGNOSIS — E785 Hyperlipidemia, unspecified: Secondary | ICD-10-CM | POA: Insufficient documentation

## 2013-06-10 DIAGNOSIS — M5432 Sciatica, left side: Secondary | ICD-10-CM

## 2013-06-10 DIAGNOSIS — M5430 Sciatica, unspecified side: Secondary | ICD-10-CM

## 2013-06-10 DIAGNOSIS — I10 Essential (primary) hypertension: Secondary | ICD-10-CM

## 2013-06-10 MED ORDER — FOSINOPRIL SODIUM-HCTZ 20-12.5 MG PO TABS: 2.0000 | ORAL_TABLET | Freq: Every day | ORAL | Status: DC

## 2013-06-10 MED ORDER — MELOXICAM 15 MG PO TABS: 15.0000 mg | ORAL_TABLET | Freq: Every day | ORAL | Status: DC

## 2013-06-10 MED ORDER — ROSUVASTATIN CALCIUM 10 MG PO TABS: 10.0000 mg | ORAL_TABLET | Freq: Every day | ORAL | Status: DC

## 2013-06-10 NOTE — Progress Notes (Addendum)
Subjective:    Patient ID: Carla Fleming, female    DOB: 27-Jun-1946, 67 y.o.   MRN: [id number]  Hypertension This is a chronic problem. The current episode started more than 1 year ago. The problem is unchanged. The problem is uncontrolled. Pertinent negatives include no blurred vision, chest pain, headaches, orthopnea, peripheral edema, shortness of breath or sweats. There are no associated agents to hypertension. Risk factors for coronary artery disease include dyslipidemia, family history and post-menopausal state. Past treatments include ACE inhibitors and diuretics. The current treatment provides moderate improvement. There are no compliance problems.   Hyperlipidemia This is a chronic problem. The current episode started more than 1 year ago. The problem is uncontrolled. Recent lipid tests were reviewed and are high. She has no history of diabetes, hypothyroidism, liver disease or obesity. Factors aggravating her hyperlipidemia include thiazides. Associated symptoms include leg pain (worse at night - started about 2 months ago. No better despite aleve.). Pertinent negatives include no chest pain, focal sensory loss, myalgias or shortness of breath. Current antihyperlipidemic treatment includes statins. The current treatment provides mild improvement of lipids. Compliance problems include adherence to diet and adherence to exercise.  Risk factors for coronary artery disease include hypertension and family history.   P{atient was doing yard work several months ago and has been having left buttocks and leg pain intermittently since- seems to be constatne at night over th elast several weeks- motrin helps some.   Review of Systems  Constitutional: Negative.   HENT: Negative.   Eyes: Negative for blurred vision.  Respiratory: Negative for shortness of breath.   Cardiovascular: Negative for chest pain and orthopnea.  Musculoskeletal: Negative for myalgias.  Neurological: Negative for headaches.   All other systems reviewed and are negative.       Objective:   Physical Exam  Constitutional: She is oriented to person, place, and time. She appears well-developed and well-nourished.  HENT:  Nose: Nose normal.  Mouth/Throat: Oropharynx is clear and moist.  Eyes: EOM are normal.  Neck: Trachea normal, normal range of motion and full passive range of motion without pain. Neck supple. No JVD present. Carotid bruit is not present. No thyromegaly present.  Cardiovascular: Normal rate, regular rhythm, normal heart sounds and intact distal pulses.  Exam reveals no gallop and no friction rub.   No murmur heard. Pulmonary/Chest: Effort normal and breath sounds normal.  Abdominal: Soft. Bowel sounds are normal. She exhibits no distension and no mass. There is no tenderness.  Musculoskeletal: Normal range of motion.  Negative SLR left-   Lymphadenopathy:    She has no cervical adenopathy.  Neurological: She is alert and oriented to person, place, and time. She has normal reflexes.  Skin: Skin is warm and dry.  Psychiatric: She has a normal mood and affect. Her behavior is normal. Judgment and thought content normal.   BP 158/98  Pulse 74  Temp(Src) 97.2 F (36.2 C) (Oral)  Ht 5\' 3"  (1.6 m)  Wt 157 lb (71.215 kg)  BMI 27.82 kg/m2        Assessment & Plan:   1. Hypertension   2. Hyperlipidemia LDL goal < 100   3. Sciatica neuralgia, left     Meds ordered this encounter  Medications  . Vitamin D, Ergocalciferol, (DRISDOL) 50000 UNITS CAPS capsule    Sig:   . DISCONTD: CRESTOR 10 MG tablet    Sig:   . fosinopril-hydrochlorothiazide (MONOPRIL-HCT) 20-12.5 MG per tablet    Sig: Take 2 tablets by  mouth daily.    Dispense:  180 tablet    Refill:  1    Order Specific Question:  Supervising Provider    Answer:  Ernestina Penna [[id number]]  . rosuvastatin (CRESTOR) 10 MG tablet    Sig: Take 1 tablet (10 mg total) by mouth daily.    Dispense:  90 tablet    Refill:  1    Order  Specific Question:  Supervising Provider    Answer:  Ernestina Penna [[id number]]  . meloxicam (MOBIC) 15 MG tablet    Sig: Take 1 tablet (15 mg total) by mouth daily.    Dispense:  30 tablet    Refill:  2    Order Specific Question:  Supervising Provider    Answer:  Deborra Medina    Continue all meds- doubled blood pressure meds at appointment Labs pending Diet and exercise encouraged Health maintenance reviewed Follow up in 3 months  Mary-Margaret Daphine Deutscher, FNP

## 2013-06-10 NOTE — Patient Instructions (Signed)
Sciatica °Sciatica is pain, weakness, numbness, or tingling along the path of the sciatic nerve. The nerve starts in the lower back and runs down the back of each leg. The nerve controls the muscles in the lower leg and in the back of the knee, while also providing sensation to the back of the thigh, lower leg, and the sole of your foot. Sciatica is a symptom of another medical condition. For instance, nerve damage or certain conditions, such as a herniated disk or bone spur on the spine, pinch or put pressure on the sciatic nerve. This causes the pain, weakness, or other sensations normally associated with sciatica. Generally, sciatica only affects one side of the body. °CAUSES  °· Herniated or slipped disc. °· Degenerative disk disease. °· A pain disorder involving the narrow muscle in the buttocks (piriformis syndrome). °· Pelvic injury or fracture. °· Pregnancy. °· Tumor (rare). °SYMPTOMS  °Symptoms can vary from mild to very severe. The symptoms usually travel from the low back to the buttocks and down the back of the leg. Symptoms can include: °· Mild tingling or dull aches in the lower back, leg, or hip. °· Numbness in the back of the calf or sole of the foot. °· Burning sensations in the lower back, leg, or hip. °· Sharp pains in the lower back, leg, or hip. °· Leg weakness. °· Severe back pain inhibiting movement. °These symptoms may get worse with coughing, sneezing, laughing, or prolonged sitting or standing. Also, being overweight may worsen symptoms. °DIAGNOSIS  °Your caregiver will perform a physical exam to look for common symptoms of sciatica. He or she may ask you to do certain movements or activities that would trigger sciatic nerve pain. Other tests may be performed to find the cause of the sciatica. These may include: °· Blood tests. °· X-rays. °· Imaging tests, such as an MRI or CT scan. °TREATMENT  °Treatment is directed at the cause of the sciatic pain. Sometimes, treatment is not necessary  and the pain and discomfort goes away on its own. If treatment is needed, your caregiver may suggest: °· Over-the-counter medicines to relieve pain. °· Prescription medicines, such as anti-inflammatory medicine, muscle relaxants, or narcotics. °· Applying heat or ice to the painful area. °· Steroid injections to lessen pain, irritation, and inflammation around the nerve. °· Reducing activity during periods of pain. °· Exercising and stretching to strengthen your abdomen and improve flexibility of your spine. Your caregiver may suggest losing weight if the extra weight makes the back pain worse. °· Physical therapy. °· Surgery to eliminate what is pressing or pinching the nerve, such as a bone spur or part of a herniated disk. °HOME CARE INSTRUCTIONS  °· Only take over-the-counter or prescription medicines for pain or discomfort as directed by your caregiver. °· Apply ice to the affected area for 20 minutes, 3 4 times a day for the first 48 72 hours. Then try heat in the same way. °· Exercise, stretch, or perform your usual activities if these do not aggravate your pain. °· Attend physical therapy sessions as directed by your caregiver. °· Keep all follow-up appointments as directed by your caregiver. °· Do not wear high heels or shoes that do not provide proper support. °· Check your mattress to see if it is too soft. A firm mattress may lessen your pain and discomfort. °SEEK IMMEDIATE MEDICAL CARE IF:  °· You lose control of your bowel or bladder (incontinence). °· You have increasing weakness in the lower back,   pelvis, buttocks, or legs. °· You have redness or swelling of your back. °· You have a burning sensation when you urinate. °· You have pain that gets worse when you lie down or awakens you at night. °· Your pain is worse than you have experienced in the past. °· Your pain is lasting longer than 4 weeks. °· You are suddenly losing weight without reason. °MAKE SURE YOU: °· Understand these  instructions. °· Will watch your condition. °· Will get help right away if you are not doing well or get worse. °Document Released: 08/08/2001 Document Revised: 02/13/2012 Document Reviewed: 12/24/2011 °[employer]® Patient Information ©2014 [employer]. ° °

## 2013-11-24 ENCOUNTER — Ambulatory Visit (INDEPENDENT_AMBULATORY_CARE_PROVIDER_SITE_OTHER): Payer: Medicare Other | Admitting: Family Medicine

## 2013-11-24 VITALS — BP 165/91 | HR 89 | Temp 97.6°F | Wt 159.4 lb

## 2013-11-24 DIAGNOSIS — J209 Acute bronchitis, unspecified: Secondary | ICD-10-CM

## 2013-11-24 MED ORDER — METHYLPREDNISOLONE (PAK) 4 MG PO TABS: ORAL_TABLET | ORAL | Status: DC

## 2013-11-24 MED ORDER — AMOXICILLIN 875 MG PO TABS: 875.0000 mg | ORAL_TABLET | Freq: Two times a day (BID) | ORAL | Status: DC

## 2013-11-24 NOTE — Progress Notes (Signed)
   Subjective:    Patient ID: Carla Fleming, female    DOB: Jan 10, 1946, 68 y.o.   MRN: [id number]  HPI  This 68 y.o. female presents for evaluation of URI sx's. She has allergies and she gets Sinus infections.  She has been coughing and wheezing.  Review of Systems C/o uri sx's   No chest pain, SOB, HA, dizziness, vision change, N/V, diarrhea, constipation, dysuria, urinary urgency or frequency, myalgias, arthralgias or rash.  Objective:   Physical Exam Vital signs noted  Well developed well nourished female.  HEENT - Head atraumatic Normocephalic                Eyes - PERRLA, Conjuctiva - clear Sclera- Clear EOMI                Ears - EAC's Wnl TM's Wnl Gross Hearing WNL                Nose - Nares patent                 Throat - oropharanx wnl Respiratory - Lungs CTA bilateral Cardiac - RRR S1 and S2 without murmur GI - Abdomen soft Nontender and bowel sounds active x 4 Extremities - No edema. Neuro - Grossly intact.       Assessment & Plan:  Acute bronchitis - Plan: amoxicillin (AMOXIL) 875 MG tablet, methylPREDNIsolone (MEDROL DOSPACK) 4 MG tablet Push po fluids, rest, tylenol and motrin otc prn as directed for fever, arthralgias, and myalgias.  Follow up prn if sx's continue or persist.  Deatra CanterWilliam J Zeric Baranowski FNP

## 2013-12-08 ENCOUNTER — Ambulatory Visit (INDEPENDENT_AMBULATORY_CARE_PROVIDER_SITE_OTHER): Payer: Medicare Other | Admitting: Family Medicine

## 2013-12-08 ENCOUNTER — Encounter: Payer: Self-pay | Admitting: Family Medicine

## 2013-12-08 VITALS — BP 132/87 | HR 90 | Temp 97.6°F | Ht 63.0 in | Wt 160.0 lb

## 2013-12-08 DIAGNOSIS — T7840XA Allergy, unspecified, initial encounter: Secondary | ICD-10-CM

## 2013-12-08 DIAGNOSIS — J302 Other seasonal allergic rhinitis: Secondary | ICD-10-CM

## 2013-12-08 DIAGNOSIS — L259 Unspecified contact dermatitis, unspecified cause: Secondary | ICD-10-CM

## 2013-12-08 DIAGNOSIS — L309 Dermatitis, unspecified: Secondary | ICD-10-CM

## 2013-12-08 DIAGNOSIS — J309 Allergic rhinitis, unspecified: Secondary | ICD-10-CM

## 2013-12-08 MED ORDER — METHYLPREDNISOLONE ACETATE 80 MG/ML IJ SUSP
80.0000 mg | Freq: Once | INTRAMUSCULAR | Status: AC
  Administered 2013-12-08: 80 mg via INTRAMUSCULAR

## 2013-12-08 MED ORDER — HYDROXYZINE HCL 25 MG PO TABS: 25.0000 mg | ORAL_TABLET | Freq: Three times a day (TID) | ORAL | Status: DC | PRN

## 2013-12-08 NOTE — Progress Notes (Signed)
   Subjective:    Patient ID: Duwaine MaxinCarol H Gonzalo, female    DOB: [birth year], 68 y.o.   MRN: [id number]  HPI This 68 y.o. female presents for evaluation of URI sx's and rash on her abdomen.  She has  Been tx'd for sinusitis and she states she is still having sx's.  She is having a lot of pruritis from the Rash on he abdomen.   Review of Systems C/o rhinitis, pruritis, and rash No chest pain, SOB, HA, dizziness, vision change, N/V, diarrhea, constipation, dysuria, urinary urgency or frequency, myalgias     Objective:   Physical Exam  Vital signs noted  Well developed well nourished female.  HEENT - Head atraumatic Normocephalic                Eyes - PERRLA, Conjuctiva - clear Sclera- Clear EOMI                Ears - EAC's Wnl TM's Wnl Gross Hearing WNL                Nose - Nares with decreased patency                Throat - oropharanx wnl Respiratory - Lungs CTA bilateral Cardiac - RRR S1 and S2 without murmur GI - Abdomen soft Nontender and bowel sounds active x 4 Skin - Erythematous macular paplular rash on abdomen      Assessment & Plan:  Dermatitis - Plan: hydrOXYzine (ATARAX/VISTARIL) 25 MG tablet, methylPREDNISolone acetate (DEPO-MEDROL) injection 80 mg  Seasonal allergies - Plan: methylPREDNISolone acetate (DEPO-MEDROL) injection 80 mg  Allergic reaction - Plan: hydrOXYzine (ATARAX/VISTARIL) 25 MG tablet, methylPREDNISolone acetate (DEPO-MEDROL) injection 80 mg  Deatra CanterWilliam J Mendi Constable FNP

## 2014-05-28 ENCOUNTER — Ambulatory Visit (INDEPENDENT_AMBULATORY_CARE_PROVIDER_SITE_OTHER): Payer: Medicare Other

## 2014-05-28 DIAGNOSIS — Z23 Encounter for immunization: Secondary | ICD-10-CM

## 2014-06-05 ENCOUNTER — Ambulatory Visit (INDEPENDENT_AMBULATORY_CARE_PROVIDER_SITE_OTHER): Payer: Medicare Other

## 2014-06-05 ENCOUNTER — Encounter: Payer: Self-pay | Admitting: Family Medicine

## 2014-06-05 ENCOUNTER — Ambulatory Visit (INDEPENDENT_AMBULATORY_CARE_PROVIDER_SITE_OTHER): Payer: Medicare Other | Admitting: Family Medicine

## 2014-06-05 VITALS — BP 159/100 | HR 85 | Temp 97.2°F | Ht 63.0 in | Wt 156.0 lb

## 2014-06-05 DIAGNOSIS — R05 Cough: Secondary | ICD-10-CM

## 2014-06-05 DIAGNOSIS — I700 Atherosclerosis of aorta: Secondary | ICD-10-CM

## 2014-06-05 DIAGNOSIS — R0989 Other specified symptoms and signs involving the circulatory and respiratory systems: Secondary | ICD-10-CM

## 2014-06-05 DIAGNOSIS — J209 Acute bronchitis, unspecified: Secondary | ICD-10-CM

## 2014-06-05 DIAGNOSIS — R5381 Other malaise: Secondary | ICD-10-CM

## 2014-06-05 DIAGNOSIS — R030 Elevated blood-pressure reading, without diagnosis of hypertension: Secondary | ICD-10-CM

## 2014-06-05 DIAGNOSIS — R059 Cough, unspecified: Secondary | ICD-10-CM

## 2014-06-05 DIAGNOSIS — IMO0001 Reserved for inherently not codable concepts without codable children: Secondary | ICD-10-CM

## 2014-06-05 LAB — POCT CBC
Granulocyte percent: 69.1 %G (ref 37–80)
HEMATOCRIT: 45 % (ref 37.7–47.9)
HEMOGLOBIN: 14.8 g/dL (ref 12.2–16.2)
Lymph, poc: 2.4 (ref 0.6–3.4)
MCH: 29.1 pg (ref 27–31.2)
MCHC: 33 g/dL (ref 31.8–35.4)
MCV: 88.3 fL (ref 80–97)
MPV: 8.5 fL (ref 0–99.8)
POC Granulocyte: 5.5 (ref 2–6.9)
POC LYMPH PERCENT: 29.7 %L (ref 10–50)
Platelet Count, POC: 258 10*3/uL (ref 142–424)
RBC: 5.1 M/uL (ref 4.04–5.48)
RDW, POC: 13.4 %
WBC: 8 10*3/uL (ref 4.6–10.2)

## 2014-06-05 MED ORDER — PREDNISONE 10 MG PO TABS: ORAL_TABLET | ORAL | Status: DC

## 2014-06-05 MED ORDER — METHYLPREDNISOLONE ACETATE 80 MG/ML IJ SUSP
60.0000 mg | Freq: Once | INTRAMUSCULAR | Status: AC
  Administered 2014-06-05: 60 mg via INTRAMUSCULAR

## 2014-06-05 MED ORDER — AZITHROMYCIN 250 MG PO TABS: ORAL_TABLET | ORAL | Status: DC

## 2014-06-05 MED ORDER — ALBUTEROL SULFATE HFA 108 (90 BASE) MCG/ACT IN AERS: 2.0000 | INHALATION_SPRAY | Freq: Four times a day (QID) | RESPIRATORY_TRACT | Status: DC | PRN

## 2014-06-05 NOTE — Progress Notes (Signed)
Subjective:    Patient ID: Carla Fleming H Fleming, female    DOB: 1945-09-18, 68 y.o.   MRN: [id number]  HPI Patient here today for sinus trouble, cough and congestion that started about 5 days ago. She usually has seasonal allergy problems and sinus but she is he does not have a cough. She has not had a chest x-ray and I long time.       Patient Active Problem List   Diagnosis Date Noted  . Hypertension 06/10/2013  . Hyperlipidemia LDL goal < 100 06/10/2013  . Chronic cholecystitis with calculus 06/09/2011   Outpatient Encounter Prescriptions as of 06/05/2014  Medication Sig  . cetirizine (ZYRTEC) 10 MG tablet Take 10 mg by mouth daily.  . fosinopril-hydrochlorothiazide (MONOPRIL-HCT) 20-12.5 MG per tablet Take 2 tablets by mouth daily.  . rosuvastatin (CRESTOR) 10 MG tablet Take 1 tablet (10 mg total) by mouth daily.  . Vitamin D, Ergocalciferol, (DRISDOL) 50000 UNITS CAPS capsule   . [DISCONTINUED] hydrOXYzine (ATARAX/VISTARIL) 25 MG tablet Take 1 tablet (25 mg total) by mouth 3 (three) times daily as needed.  . [DISCONTINUED] meloxicam (MOBIC) 15 MG tablet Take 1 tablet (15 mg total) by mouth daily.    Review of Systems  Constitutional: Negative.  Negative for fever.  HENT: Positive for congestion, postnasal drip and sinus pressure.   Eyes: Negative.   Respiratory: Positive for cough and wheezing.   Cardiovascular: Negative.   Gastrointestinal: Positive for nausea. Constipation: from drainage.  Endocrine: Negative.   Genitourinary: Negative.   Musculoskeletal: Negative.   Skin: Negative.   Allergic/Immunologic: Negative.   Neurological: Positive for headaches.  Hematological: Negative.   Psychiatric/Behavioral: Negative.        Objective:   Physical Exam  Nursing note and vitals reviewed. Constitutional: She is oriented to person, place, and time. She appears well-developed and well-nourished. No distress.  HENT:  Head: Normocephalic and atraumatic.  Right Ear:  External ear normal.  Left Ear: External ear normal.  Mouth/Throat: Oropharynx is clear and moist. No oropharyngeal exudate.  Allergic rhinitis  Eyes: Conjunctivae and EOM are normal. Pupils are equal, round, and reactive to light. Right eye exhibits no discharge. Left eye exhibits no discharge. No scleral icterus.  Neck: Normal range of motion. Neck supple. No thyromegaly present.  There is turbinate congestion bilaterally  Cardiovascular: Normal rate, regular rhythm, normal heart sounds and intact distal pulses.   No murmur heard. Pulmonary/Chest: Effort normal. No respiratory distress. She has wheezes. She has no rales. She exhibits no tenderness.  The patient has wheezes and rhonchi bilaterally left greater than right  Abdominal: Soft. Bowel sounds are normal. She exhibits no mass. There is no tenderness. There is no rebound and no guarding.  Musculoskeletal: Normal range of motion. She exhibits no edema.  Lymphadenopathy:    She has no cervical adenopathy.  Neurological: She is alert and oriented to person, place, and time.  Skin: Skin is warm and dry. No rash noted.  Psychiatric: She has a normal mood and affect. Her behavior is normal. Judgment and thought content normal.   BP 159/100  Pulse 85  Temp(Src) 97.2 F (36.2 C) (Oral)  Ht 5\' 3"  (1.6 m)  Wt 156 lb (70.761 kg)  BMI 27.64 kg/m2  [facility] reading (PRIMARY) by  Dr. Tracie HarrierMoore-chest x-ray--degenerative changes in the spine and atherosclerosis in the aorta The patient was informed of these results        Assessment & Plan:   1. Cough - DG Chest 2 View;  Future - POCT CBC - albuterol (PROVENTIL HFA;VENTOLIN HFA) 108 (90 BASE) MCG/ACT inhaler; Inhale 2 puffs into the lungs every 6 (six) hours as needed for wheezing or shortness of breath.  Dispense: 1 Inhaler; Refill: 2  2. Chest congestion - DG Chest 2 View; Future - POCT CBC - albuterol (PROVENTIL HFA;VENTOLIN HFA) 108 (90 BASE) MCG/ACT inhaler; Inhale 2 puffs into the  lungs every 6 (six) hours as needed for wheezing or shortness of breath.  Dispense: 1 Inhaler; Refill: 2 - predniSONE (DELTASONE) 10 MG tablet; 1 tablet 4 times a day for 2 days,  1 tablet 3 times a day for 2 days,  1 tablet 2 times a day for 2 days, 1 tablet daily for 2 days  Dispense: 20 tablet; Refill: 0  3. Acute bronchitis, unspecified organism - azithromycin (ZITHROMAX) 250 MG tablet; 2 Pills the first day then one daily for infection until complete  Dispense: 6 tablet; Refill: 0  4. Bronchospasm with bronchitis, acute - albuterol (PROVENTIL HFA;VENTOLIN HFA) 108 (90 BASE) MCG/ACT inhaler; Inhale 2 puffs into the lungs every 6 (six) hours as needed for wheezing or shortness of breath.  Dispense: 1 Inhaler; Refill: 2 - predniSONE (DELTASONE) 10 MG tablet; 1 tablet 4 times a day for 2 days,  1 tablet 3 times a day for 2 days,  1 tablet 2 times a day for 2 days, 1 tablet daily for 2 days  Dispense: 20 tablet; Refill: 0 - methylPREDNISolone acetate (DEPO-MEDROL) injection 60 mg; Inject 0.75 mLs (60 mg total) into the muscle once.  5. Elevated blood pressure  6. Malaise  7. Aortic atherosclerosis -Better cholesterol management  Patient Instructions  Take blood pressure medicine regularly Watch sodium intake Bring blood pressures by for review in 2-3 weeks Continue with Flonase and Zyrtec Take Mucinex maximum strength, blue and white in color, 1 twice daily for cough and congestion with a large glass of water Use saline nose spray during the day Take antibiotic as directed Take prednisone as directed Use inhaler regularly   [name] W. Shani Fitch MD

## 2014-06-05 NOTE — Patient Instructions (Signed)
Take blood pressure medicine regularly Watch sodium intake Bring blood pressures by for review in 2-3 weeks Continue with Flonase and Zyrtec Take Mucinex maximum strength, blue and white in color, 1 twice daily for cough and congestion with a large glass of water Use saline nose spray during the day Take antibiotic as directed Take prednisone as directed Use inhaler regularly

## 2014-06-09 ENCOUNTER — Telehealth: Payer: Self-pay

## 2014-06-09 NOTE — Telephone Encounter (Signed)
Left results of CXR on home answering machine as per DPR of 06/09/2013 with instructions to call us back if further questions

## 2014-06-09 NOTE — Telephone Encounter (Signed)
Message copied by Roselee CulverHUMLEY, Bretta Fees on Tue Jun 09, 2014  9:31 AM ------      Message from: Ernestina PennaMOORE, DONALD W      Created: Fri Jun 05, 2014  5:05 PM       As per radiology report ------

## 2014-06-18 ENCOUNTER — Ambulatory Visit (INDEPENDENT_AMBULATORY_CARE_PROVIDER_SITE_OTHER): Payer: Medicare Other | Admitting: Family Medicine

## 2014-06-18 ENCOUNTER — Encounter: Payer: Self-pay | Admitting: Family Medicine

## 2014-06-18 VITALS — BP 157/106 | HR 73 | Temp 96.3°F | Ht 63.0 in | Wt 155.0 lb

## 2014-06-18 DIAGNOSIS — I10 Essential (primary) hypertension: Secondary | ICD-10-CM

## 2014-06-18 DIAGNOSIS — R030 Elevated blood-pressure reading, without diagnosis of hypertension: Principal | ICD-10-CM

## 2014-06-18 DIAGNOSIS — IMO0001 Reserved for inherently not codable concepts without codable children: Secondary | ICD-10-CM

## 2014-06-18 NOTE — Progress Notes (Signed)
Subjective:    Patient ID: Duwaine MaxinCarol H Gonder, female    DOB: 1946/04/21, 68 y.o.   MRN: [id number]  HPI Patient here today for 2 week follow up on cough and elevated BP. She states the cough and congestion is much improved, but she is still having a hard time regulating her BP readings. She brings in her home wrist BP cuff today for comparison and it is reading slightly higher. The home blood pressure readings will be scanned into the record. She is currently taking fosinopril 20/12.5. She is taking this twice a day        Patient Active Problem List   Diagnosis Date Noted  . Hypertension 06/10/2013  . Hyperlipidemia LDL goal < 100 06/10/2013  . Chronic cholecystitis with calculus 06/09/2011   Outpatient Encounter Prescriptions as of 06/18/2014  Medication Sig  . albuterol (PROVENTIL HFA;VENTOLIN HFA) 108 (90 BASE) MCG/ACT inhaler Inhale 2 puffs into the lungs every 6 (six) hours as needed for wheezing or shortness of breath.  . cetirizine (ZYRTEC) 10 MG tablet Take 10 mg by mouth daily.  . fosinopril-hydrochlorothiazide (MONOPRIL-HCT) 20-12.5 MG per tablet Take 1 tablet by mouth 2 (two) times daily.  . rosuvastatin (CRESTOR) 10 MG tablet Take 1 tablet (10 mg total) by mouth daily.  . Vitamin D, Ergocalciferol, (DRISDOL) 50000 UNITS CAPS capsule   . [DISCONTINUED] fosinopril-hydrochlorothiazide (MONOPRIL-HCT) 20-12.5 MG per tablet Take 2 tablets by mouth daily.  . [DISCONTINUED] azithromycin (ZITHROMAX) 250 MG tablet 2 Pills the first day then one daily for infection until complete  . [DISCONTINUED] predniSONE (DELTASONE) 10 MG tablet 1 tablet 4 times a day for 2 days,  1 tablet 3 times a day for 2 days,  1 tablet 2 times a day for 2 days, 1 tablet daily for 2 days    Review of Systems  Constitutional: Negative.   HENT: Negative.   Eyes: Negative.   Respiratory: Negative.   Cardiovascular: Negative.   Gastrointestinal: Negative.   Endocrine: Negative.   Genitourinary:  Negative.   Musculoskeletal: Negative.   Skin: Negative.   Allergic/Immunologic: Negative.   Neurological: Negative.   Hematological: Negative.   Psychiatric/Behavioral: Negative.        Objective:   Physical Exam  Nursing note and vitals reviewed. Constitutional: She is oriented to person, place, and time. She appears well-developed and well-nourished.  HENT:  Head: Normocephalic and atraumatic.  Right Ear: External ear normal.  Left Ear: External ear normal.  Nose: Nose normal.  Mouth/Throat: Oropharynx is clear and moist.  Eyes: Conjunctivae and EOM are normal. Pupils are equal, round, and reactive to light. Right eye exhibits no discharge. Left eye exhibits no discharge. No scleral icterus.  Neck: Normal range of motion. Neck supple. No thyromegaly present.  Cardiovascular: Normal rate, regular rhythm and normal heart sounds.  Exam reveals no gallop and no friction rub.   No murmur heard. Pulmonary/Chest: Effort normal and breath sounds normal. No respiratory distress. She has no wheezes. She has no rales. She exhibits no tenderness.  Musculoskeletal: Normal range of motion. She exhibits no edema.  Lymphadenopathy:    She has no cervical adenopathy.  Neurological: She is alert and oriented to person, place, and time.  Skin: Skin is warm and dry. No rash noted.  Psychiatric: She has a normal mood and affect. Her behavior is normal. Judgment and thought content normal.    BP 157/106  Pulse 73  Temp(Src) 96.3 F (35.7 C) (Oral)  Ht 5\' 3"  (1.6 m)  Wt 155 lb (70.308 kg)  BMI 27.46 kg/m2  Repeat blood pressure with a manual cuff 140/96 right arm sitting     Assessment & Plan:  1. Elevated blood pressure  2. Essential hypertension  Patient Instructions  Continue to monitor blood pressures at home using both your wrist monitor and the arm monitor that we are lending to you. Continue to watch her sodium intake Take both pills in the morning for your blood pressure at  one time. Bring blood pressure readings from home and 2-3 weeks and let her nurse check your blood pressure again and let us review the readings that you return   [name] W. Moore MD

## 2014-06-18 NOTE — Patient Instructions (Signed)
Continue to monitor blood pressures at home using both your wrist monitor and the arm monitor that we are lending to you. Continue to watch her sodium intake Take both pills in the morning for your blood pressure at one time. Bring blood pressure readings from home and 2-3 weeks and let her nurse check your blood pressure again and let us review the readings that you return

## 2014-06-29 ENCOUNTER — Ambulatory Visit (INDEPENDENT_AMBULATORY_CARE_PROVIDER_SITE_OTHER): Payer: Medicare Other | Admitting: Gynecology

## 2014-06-29 ENCOUNTER — Encounter: Payer: Self-pay | Admitting: Gynecology

## 2014-06-29 ENCOUNTER — Other Ambulatory Visit (HOSPITAL_COMMUNITY)
Admission: RE | Admit: 2014-06-29 | Discharge: 2014-06-29 | Disposition: A | Discharge status: Home / Self Care | Payer: Medicare Other | Source: Ambulatory Visit | Attending: Gynecology | Admitting: Gynecology

## 2014-06-29 VITALS — BP 128/86 | Ht 62.0 in | Wt 155.0 lb

## 2014-06-29 DIAGNOSIS — Z1151 Encounter for screening for human papillomavirus (HPV): Secondary | ICD-10-CM | POA: Insufficient documentation

## 2014-06-29 DIAGNOSIS — Z124 Encounter for screening for malignant neoplasm of cervix: Secondary | ICD-10-CM | POA: Diagnosis present

## 2014-06-29 DIAGNOSIS — R140 Abdominal distension (gaseous): Secondary | ICD-10-CM

## 2014-06-29 DIAGNOSIS — K5900 Constipation, unspecified: Secondary | ICD-10-CM

## 2014-06-29 DIAGNOSIS — M8580 Other specified disorders of bone density and structure, unspecified site: Secondary | ICD-10-CM

## 2014-06-29 DIAGNOSIS — N952 Postmenopausal atrophic vaginitis: Secondary | ICD-10-CM

## 2014-06-29 NOTE — Patient Instructions (Signed)

## 2014-06-29 NOTE — Progress Notes (Signed)
Carla Fleming [birth year] [id number]   History:    68 y.o.  for annual gyn exam who is new to the practice. Patient been complaining of bloating. Patient complains at times of constipation interchange with loose stool. She also claims that when she eats sometimes she has to go to the bathroom to have bowel movement quickly. She states that she had a colonoscopy in 2013 and was normal. She was getting her gynecological exam from her primary care physician wanted to be seen by gynecologists. She stated that over 30 years ago she had some abnormal Pap smear had colposcopy and biopsy and subsequent Pap smears were normal. No treatment reported. She had a bone density study done at her PCPs office 2 years ago she stated she was informed she had osteopenia for which she's taking calcium and vitamin D. Patient states her flu vaccine in shingles vaccines are up-to-date. Her mammogram was in 2014 which was normal.  Past medical history,surgical history, family history and social history were all reviewed and documented in the EPIC chart.  Gynecologic History No LMP recorded. Patient is postmenopausal. Contraception: post menopausal status Last Pap: 2013. Results were: normal Last mammogram: 2014. Results were: normal  Obstetric History OB History  Gravida Para Term Preterm AB SAB TAB Ectopic Multiple Living  5 2 2  1          # Outcome Date GA Lbr Len/2nd Weight Sex Delivery Anes PTL Lv  5 Gravida           4 Gravida           3 AB           2 Term           1 Term                ROS: A ROS was performed and pertinent positives and negatives are included in the history.  GENERAL: No fevers or chills. HEENT: No change in vision, no earache, sore throat or sinus congestion. NECK: No pain or stiffness. CARDIOVASCULAR: No chest pain or pressure. No palpitations. PULMONARY: No shortness of breath, cough or wheeze. GASTROINTESTINAL: No abdominal pain, nausea, vomiting or diarrhea, melena or bright  red blood per rectum. GENITOURINARY: No urinary frequency, urgency, hesitancy or dysuria. MUSCULOSKELETAL: No joint or muscle pain, no back pain, no recent trauma. DERMATOLOGIC: No rash, no itching, no lesions. ENDOCRINE: No polyuria, polydipsia, no heat or cold intolerance. No recent change in weight. HEMATOLOGICAL: No anemia or easy bruising or bleeding. NEUROLOGIC: No headache, seizures, numbness, tingling or weakness. PSYCHIATRIC: No depression, no loss of interest in normal activity or change in sleep pattern.     Exam: chaperone present  BP 128/86 mmHg  Ht 5\' 2"  (1.575 m)  Wt 155 lb (70.308 kg)  BMI 28.34 kg/m2  Body mass index is 28.34 kg/(m^2).  General appearance : Well developed well nourished female. No acute distress HEENT: Neck supple, trachea midline, no carotid bruits, no thyroidmegaly Lungs: Clear to auscultation, no rhonchi or wheezes, or rib retractions  Heart: Regular rate and rhythm, no murmurs or gallops Breast:Examined in sitting and supine position were symmetrical in appearance, no palpable masses or tenderness,  no skin retraction, no nipple inversion, no nipple discharge, no skin discoloration, no axillary or supraclavicular lymphadenopathy Abdomen: no palpable masses or tenderness, no rebound or guarding Extremities: no edema or skin discoloration or tenderness  Pelvic:  Bartholin, Urethra, Skene Glands: Within normal limits  Vagina: No gross lesions or discharge atrophic changes  Cervix: No gross lesions or discharge  Uterus  anteverted, normal size, shape and consistency, non-tender and mobile  Adnexa  Without masses or tenderness  Anus and perineum  normal   Rectovaginal  normal sphincter tone without palpated masses or tenderness             Hemoccult cards will be provided     Assessment/Plan:  68 y.o. female for annual exam will return back to the office for an ultrasound for better assessment of her uterus and adnexa due to her bloating  sensation to rule out any GYN pathology. If ultrasound is normal have recommended she follow-up with her gastroenterologist. I did provided with fecal Hemoccult cards to submit to the office for testing when she returned back for her ultrasound. PCP we'll be doing her blood work. We did do a Pap smear today and the new guidelines were discussed. She was reminded on the importance of monthly breast exam. We discussed also the importance of regular exercise lower calcium vitamin D for osteoporosis prevention.   Ok EdwardsFERNANDEZ,Daegon Deiss H MD, 10:17 AM 06/29/2014

## 2014-06-30 LAB — CYTOLOGY - PAP

## 2014-07-10 ENCOUNTER — Ambulatory Visit (INDEPENDENT_AMBULATORY_CARE_PROVIDER_SITE_OTHER): Payer: Medicare Other

## 2014-07-10 VITALS — BP 136/79 | HR 80

## 2014-07-10 DIAGNOSIS — IMO0001 Reserved for inherently not codable concepts without codable children: Secondary | ICD-10-CM

## 2014-07-10 DIAGNOSIS — R030 Elevated blood-pressure reading, without diagnosis of hypertension: Secondary | ICD-10-CM

## 2014-07-10 NOTE — Progress Notes (Signed)
Good readings, continue current treatment and sodium restriction as much as possible

## 2014-07-17 ENCOUNTER — Ambulatory Visit (INDEPENDENT_AMBULATORY_CARE_PROVIDER_SITE_OTHER): Payer: Medicare Other | Admitting: Gynecology

## 2014-07-17 ENCOUNTER — Ambulatory Visit (INDEPENDENT_AMBULATORY_CARE_PROVIDER_SITE_OTHER): Payer: Medicare Other

## 2014-07-17 ENCOUNTER — Encounter: Payer: Self-pay | Admitting: Gynecology

## 2014-07-17 DIAGNOSIS — R140 Abdominal distension (gaseous): Secondary | ICD-10-CM

## 2014-07-17 DIAGNOSIS — K589 Irritable bowel syndrome without diarrhea: Secondary | ICD-10-CM

## 2014-07-17 DIAGNOSIS — K5900 Constipation, unspecified: Secondary | ICD-10-CM

## 2014-07-17 NOTE — Progress Notes (Signed)
   Patient was seen in the office for her annual exam on November 2. Patient had been complaining of abdominal bloating.Patient complains at times of constipation interchange with loose stool. She also claims that when she eats sometimes she has to go to the bathroom to have bowel movement quickly. She states that she had a colonoscopy in 2013 and was normal. She is here to discuss her pelvic ultrasound.  Ultrasound today: Uterus measured 4.7 x 3.7 x 2.3 cm within a meter stripe of 2.1 mm. Normal uterus and ovaries. No fluid in the cul-de-sac.  Pap smear done recently normal PCP doing blood work.  Assessment/plan: Patient's abdominal bloating along with constipation and loose stools merits further evaluation by her gastroenterologist which she will make an appointment in the next few weeks. She was instructed to maintain a diary of fruits that potentially could be causing her some symptoms. Patient denies any family member of any colorectal cancer disease. She's had 2 prior colonoscopies and no polyps had been reported.

## 2014-08-11 ENCOUNTER — Encounter: Payer: Self-pay | Admitting: Nurse Practitioner

## 2014-08-11 ENCOUNTER — Ambulatory Visit (INDEPENDENT_AMBULATORY_CARE_PROVIDER_SITE_OTHER): Payer: Medicare Other | Admitting: Nurse Practitioner

## 2014-08-11 DIAGNOSIS — Z0000 Encounter for general adult medical examination without abnormal findings: Secondary | ICD-10-CM

## 2014-08-11 DIAGNOSIS — Z13820 Encounter for screening for osteoporosis: Secondary | ICD-10-CM

## 2014-08-11 DIAGNOSIS — E785 Hyperlipidemia, unspecified: Secondary | ICD-10-CM

## 2014-08-11 DIAGNOSIS — I10 Essential (primary) hypertension: Secondary | ICD-10-CM

## 2014-08-11 LAB — POCT UA - MICROSCOPIC ONLY
BACTERIA, U MICROSCOPIC: NEGATIVE
CASTS, UR, LPF, POC: NEGATIVE
CRYSTALS, UR, HPF, POC: NEGATIVE
RBC, urine, microscopic: NEGATIVE
Yeast, UA: NEGATIVE

## 2014-08-11 LAB — POCT URINALYSIS DIPSTICK
BILIRUBIN UA: NEGATIVE
GLUCOSE UA: NEGATIVE
Ketones, UA: NEGATIVE
Nitrite, UA: NEGATIVE
RBC UA: NEGATIVE
Spec Grav, UA: 1.025
Urobilinogen, UA: NEGATIVE
pH, UA: 5

## 2014-08-11 LAB — POCT CBC
Granulocyte percent: 69.4 %G (ref 37–80)
HCT, POC: 42 % (ref 37.7–47.9)
Hemoglobin: 14.5 g/dL (ref 12.2–16.2)
LYMPH, POC: 2.6 (ref 0.6–3.4)
MCH: 30.6 pg (ref 27–31.2)
MCHC: 34.6 g/dL (ref 31.8–35.4)
MCV: 88.5 fL (ref 80–97)
MPV: 8.7 fL (ref 0–99.8)
PLATELET COUNT, POC: 327 10*3/uL (ref 142–424)
POC Granulocyte: 6.8 (ref 2–6.9)
POC LYMPH PERCENT: 26.9 %L (ref 10–50)
RBC: 4.8 M/uL (ref 4.04–5.48)
RDW, POC: 14 %
WBC: 9.8 10*3/uL (ref 4.6–10.2)

## 2014-08-11 MED ORDER — FOSINOPRIL SODIUM-HCTZ 20-12.5 MG PO TABS: 1.0000 | ORAL_TABLET | Freq: Two times a day (BID) | ORAL | Status: DC

## 2014-08-11 NOTE — Progress Notes (Signed)
   Subjective:    Patient ID: Carla Fleming, female    DOB: 01/21/1946, 68 y.o.   MRN: [id number]  HPI Patient here today for annual physical exam- had PAP done by dr. Toney Rakes 1 month ago- [facility] has been having issues with blotting and GYN told her he thinks that it is IBS. SHe still has a lot of blotting. Has to go to the restroom right after eating. Some foods seem to bother her worse. Had a colonoscopy 3 years ago which was negative. Otherwise she is doing well. [facility] medical problems Patient Active Problem List   Diagnosis Date Noted  . Hypertension 06/10/2013  . Hyperlipidemia LDL goal < 100 06/10/2013  . Chronic cholecystitis with calculus 06/09/2011   Outpatient Encounter Prescriptions as of 08/11/2014  Medication Sig  . cetirizine (ZYRTEC) 10 MG tablet Take 10 mg by mouth daily.  . fosinopril-hydrochlorothiazide (MONOPRIL-HCT) 20-12.5 MG per tablet Take 1 tablet by mouth 2 (two) times daily.  Marland Kitchen albuterol (PROVENTIL HFA;VENTOLIN HFA) 108 (90 BASE) MCG/ACT inhaler Inhale 2 puffs into the lungs every 6 (six) hours as needed for wheezing or shortness of breath. (Patient not taking: Reported on 08/11/2014)  . Vitamin D, Ergocalciferol, (DRISDOL) 50000 UNITS CAPS capsule       Review of Systems  Constitutional: Negative.   HENT: Negative.   Respiratory: Negative.   Cardiovascular: Negative.   Gastrointestinal: Positive for diarrhea and constipation.  Genitourinary: Negative.   Skin: Negative.   Neurological: Negative.   Psychiatric/Behavioral: Negative.   All other systems reviewed and are negative.      Objective:   Physical Exam  Constitutional: She is oriented to person, place, and time. She appears well-developed and well-nourished.  HENT:  Nose: Nose normal.  Mouth/Throat: Oropharynx is clear and moist.  Eyes: EOM are normal.  Neck: Trachea normal, normal range of motion and full passive range of motion without pain. Neck supple. No JVD present. Carotid bruit is  not present. No thyromegaly present.  Cardiovascular: Normal rate, regular rhythm, normal heart sounds and intact distal pulses.  Exam reveals no gallop and no friction rub.   No murmur heard. Pulmonary/Chest: Effort normal and breath sounds normal.  Abdominal: Soft. Bowel sounds are normal. She exhibits no distension and no mass. There is no tenderness.  Musculoskeletal: Normal range of motion.  Lymphadenopathy:    She has no cervical adenopathy.  Neurological: She is alert and oriented to person, place, and time. She has normal reflexes.  Skin: Skin is warm and dry.  Psychiatric: She has a normal mood and affect. Her behavior is normal. Judgment and thought content normal.   BP 128/83 mmHg  Pulse 93  Temp(Src) 96.8 F (36 C) (Oral)  Ht $R'5\' 3"'aP$  (1.6 m)  Wt 153 lb (69.4 kg)  BMI 27.11 kg/m2   Adella Nissen, FNP      Assessment & Plan:  1. Annual physical exam - POCT UA - Microscopic Only - POCT urinalysis dipstick - POCT CBC  2. Essential hypertension Do not add salt to diet - CMP14+EGFR - Thyroid Panel With TSH - EKG 12-Lead  3. Hyperlipidemia with target LDL less than 100 Low fat diet - NMR, lipoprofile  4. Screening for osteoporosis Weight bearing exercises encouraged - DG Bone Density; Future    Labs pending Health maintenance reviewed Diet and exercise encouraged Continue all meds Follow up  In 6 months   [place], FNP

## 2014-08-11 NOTE — Patient Instructions (Signed)

## 2014-08-12 LAB — NMR, LIPOPROFILE
Cholesterol: 232 mg/dL — ABNORMAL HIGH (ref 100–199)
HDL Cholesterol by NMR: 55 mg/dL (ref >39)
HDL Particle Number: 34.2 umol/L (ref >=30.5)
LDL PARTICLE NUMBER: 1638 nmol/L — AB (ref <1000)
LDL Size: 21.1 nm (ref >20.5)
LDL-C: 155 mg/dL — ABNORMAL HIGH (ref 0–99)
LP-IR Score: 43 (ref <=45)
SMALL LDL PARTICLE NUMBER: 567 nmol/L — AB (ref <=527)
Triglycerides by NMR: 111 mg/dL (ref 0–149)

## 2014-08-12 LAB — CMP14+EGFR
ALT: 16 IU/L (ref 0–32)
AST: 22 IU/L (ref 0–40)
Albumin/Globulin Ratio: 1.8 (ref 1.1–2.5)
Albumin: 4.3 g/dL (ref 3.6–4.8)
Alkaline Phosphatase: 111 IU/L (ref 39–117)
BUN/Creatinine Ratio: 21 (ref 11–26)
BUN: 17 mg/dL (ref 8–27)
CHLORIDE: 102 mmol/L (ref 97–108)
CO2: 26 mmol/L (ref 18–29)
Calcium: 9.5 mg/dL (ref 8.7–10.3)
Creatinine, Ser: 0.82 mg/dL (ref 0.57–1.00)
GFR calc Af Amer: 85 mL/min/{1.73_m2} (ref >59)
GFR calc non Af Amer: 74 mL/min/{1.73_m2} (ref >59)
Globulin, Total: 2.4 g/dL (ref 1.5–4.5)
Glucose: 84 mg/dL (ref 65–99)
Potassium: 4 mmol/L (ref 3.5–5.2)
SODIUM: 141 mmol/L (ref 134–144)
Total Bilirubin: 0.6 mg/dL (ref 0.0–1.2)
Total Protein: 6.7 g/dL (ref 6.0–8.5)

## 2014-08-12 LAB — THYROID PANEL WITH TSH
Free Thyroxine Index: 3 (ref 1.2–4.9)
T3 Uptake Ratio: 26 % (ref 24–39)
T4 TOTAL: 11.4 ug/dL (ref 4.5–12.0)
TSH: 2.12 u[IU]/mL (ref 0.450–4.500)

## 2014-08-13 ENCOUNTER — Telehealth: Payer: Self-pay | Admitting: Nurse Practitioner

## 2014-08-14 ENCOUNTER — Other Ambulatory Visit: Payer: Self-pay | Admitting: Nurse Practitioner

## 2014-08-14 DIAGNOSIS — E785 Hyperlipidemia, unspecified: Secondary | ICD-10-CM

## 2014-08-14 MED ORDER — NITROFURANTOIN MONOHYD MACRO 100 MG PO CAPS: 100.0000 mg | ORAL_CAPSULE | Freq: Two times a day (BID) | ORAL | Status: DC

## 2014-08-14 MED ORDER — SIMVASTATIN 40 MG PO TABS
40.0000 mg | ORAL_TABLET | Freq: Every day | ORAL | Status: DC
Start: 2014-08-14 — End: 2014-11-13

## 2014-08-14 NOTE — Telephone Encounter (Signed)
pt aware 

## 2014-11-13 ENCOUNTER — Encounter: Payer: Self-pay | Admitting: Nurse Practitioner

## 2014-11-13 ENCOUNTER — Ambulatory Visit (INDEPENDENT_AMBULATORY_CARE_PROVIDER_SITE_OTHER): Payer: Medicare Other | Admitting: Nurse Practitioner

## 2014-11-13 VITALS — BP 156/94 | HR 92 | Temp 96.8°F | Ht 63.0 in | Wt 157.0 lb

## 2014-11-13 DIAGNOSIS — I10 Essential (primary) hypertension: Secondary | ICD-10-CM | POA: Diagnosis not present

## 2014-11-13 DIAGNOSIS — Z6827 Body mass index (BMI) 27.0-27.9, adult: Secondary | ICD-10-CM | POA: Diagnosis not present

## 2014-11-13 DIAGNOSIS — J0110 Acute frontal sinusitis, unspecified: Secondary | ICD-10-CM | POA: Diagnosis not present

## 2014-11-13 DIAGNOSIS — E785 Hyperlipidemia, unspecified: Secondary | ICD-10-CM

## 2014-11-13 MED ORDER — AMLODIPINE BESYLATE 2.5 MG PO TABS: 2.5000 mg | ORAL_TABLET | Freq: Every day | ORAL | Status: DC

## 2014-11-13 MED ORDER — SIMVASTATIN 40 MG PO TABS: 40.0000 mg | ORAL_TABLET | Freq: Every day | ORAL | Status: DC

## 2014-11-13 MED ORDER — AZITHROMYCIN 250 MG PO TABS: ORAL_TABLET | ORAL | Status: DC

## 2014-11-13 MED ORDER — FOSINOPRIL SODIUM-HCTZ 20-12.5 MG PO TABS: 1.0000 | ORAL_TABLET | Freq: Two times a day (BID) | ORAL | Status: DC

## 2014-11-13 NOTE — Progress Notes (Signed)
Subjective:    Patient ID: Carla Fleming, female    DOB: 06-06-1946, 69 y.o.   MRN: [id number]  Patient here today for follow up of chronic medical problems.  Hypertension This is a chronic problem. The current episode started more than 1 year ago. The problem is unchanged. The problem is uncontrolled. Pertinent negatives include no headaches, orthopnea, palpitations, peripheral edema or shortness of breath. Risk factors for coronary artery disease include dyslipidemia, post-menopausal state and sedentary lifestyle. Past treatments include ACE inhibitors and diuretics. The current treatment provides moderate improvement. Compliance problems include diet and exercise.  There is no history of CAD/MI, CVA or PVD.  Hyperlipidemia This is a chronic problem. The current episode started more than 1 year ago. The problem is uncontrolled. Recent lipid tests were reviewed and are variable. She has no history of diabetes, hypothyroidism or obesity. Factors aggravating her hyperlipidemia include thiazides. Pertinent negatives include no shortness of breath. Treatments tried: was suppose to strat on zocor at last visit but never got rx. The current treatment provides moderate improvement of lipids. Compliance problems include adherence to diet and adherence to exercise.  Risk factors for coronary artery disease include dyslipidemia, hypertension and post-menopausal.    * C/O cough and congestion that started about 4 days ago- denies any fever or sore throat.   Review of Systems  Constitutional: Negative.   HENT: Negative.   Respiratory: Negative for shortness of breath.   Cardiovascular: Negative for palpitations and orthopnea.  Neurological: Negative for headaches.  All other systems reviewed and are negative.      Objective:   Physical Exam  Constitutional: She is oriented to person, place, and time. She appears well-developed and well-nourished.  HENT:  Right Ear: Hearing, tympanic membrane,  external ear and ear canal normal.  Left Ear: Hearing, tympanic membrane, external ear and ear canal normal.  Nose: Mucosal edema and rhinorrhea present. Right sinus exhibits frontal sinus tenderness. Right sinus exhibits no maxillary sinus tenderness. Left sinus exhibits frontal sinus tenderness. Left sinus exhibits no maxillary sinus tenderness.  Mouth/Throat: Uvula is midline and oropharynx is clear and moist.  Eyes: EOM are normal.  Neck: Trachea normal, normal range of motion and full passive range of motion without pain. Neck supple. No JVD present. Carotid bruit is not present. No thyromegaly present.  Cardiovascular: Normal rate, regular rhythm, normal heart sounds and intact distal pulses.  Exam reveals no gallop and no friction rub.   No murmur heard. Pulmonary/Chest: Effort normal and breath sounds normal.  Abdominal: Soft. Bowel sounds are normal. She exhibits no distension and no mass. There is no tenderness.  Musculoskeletal: Normal range of motion. She exhibits no edema.  Lymphadenopathy:    She has no cervical adenopathy.  Neurological: She is alert and oriented to person, place, and time. She has normal reflexes.  Skin: Skin is warm and dry.  Psychiatric: She has a normal mood and affect. Her behavior is normal. Judgment and thought content normal.    BP 156/94 mmHg  Pulse 92  Temp(Src) 96.8 F (36 C) (Oral)  Ht _0  (1.6 m)  Wt 157 lb (71.215 kg)  BMI 27.82 kg/m2        Assessment & Plan:   1. Essential hypertension Do not add salt to diet added norvasc 2.5 mg daily to meds - fosinopril-hydrochlorothiazide (MONOPRIL-HCT) 20-12.5 MG per tablet; Take 1 tablet by mouth 2 (two) times daily.  Dispense: 60 tablet; Refill: 1 - amLODipine (NORVASC) 2.5 MG tablet; Take 1  tablet (2.5 mg total) by mouth daily.  Dispense: 90 tablet; Refill: 3 - CMP14+EGFR  2. Hyperlipidemia with target LDL less than 100 Low fat diet - simvastatin (ZOCOR) 40 MG tablet; Take 1 tablet (40  mg total) by mouth at bedtime.  Dispense: 90 tablet; Refill: 1 - NMR, lipoprofile  3. Acute frontal sinusitis, recurrence not specified 1. Take meds as prescribed 2. Use a cool mist humidifier especially during the winter months and when heat has been humid. 3. Use saline nose sprays frequently 4. Saline irrigations of the nose can be very helpful if done frequently.  * 4X daily for 1 week*  * Use of a nettie pot can be helpful with this. Follow directions with this* 5. Drink plenty of fluids 6. Keep thermostat turn down low 7.For any cough or congestion  Use plain Mucinex- regular strength or max strength is fine- no decongestants   * Children- consult with Pharmacist for dosing 8. For fever or aces or pains- take tylenol or ibuprofen appropriate for age and weight.  * for fevers greater than 101 orally you may alternate ibuprofen and tylenol every  3 hours.    - azithromycin (ZITHROMAX Z-PAK) 250 MG tablet; As directed  Dispense: 1 each; Refill: 0  4. BMI 27.0-27.9,adult Discussed diet and exercise for person with BMI >25 Will recheck weight in 3-6 months   No time for dexa today will do at next visit No prevnar 13 available today- will do at next visit Labs pending Health maintenance reviewed Diet and exercise encouraged Continue all meds Follow up  In 3 month   [place]ock, FNP

## 2014-11-13 NOTE — Patient Instructions (Signed)

## 2014-11-14 LAB — CMP14+EGFR
ALBUMIN: 4.3 g/dL (ref 3.6–4.8)
ALK PHOS: 127 IU/L — AB (ref 39–117)
ALT: 14 IU/L (ref 0–32)
AST: 20 IU/L (ref 0–40)
Albumin/Globulin Ratio: 1.7 (ref 1.1–2.5)
BILIRUBIN TOTAL: 0.4 mg/dL (ref 0.0–1.2)
BUN/Creatinine Ratio: 16 (ref 11–26)
BUN: 12 mg/dL (ref 8–27)
CO2: 25 mmol/L (ref 18–29)
Calcium: 9.5 mg/dL (ref 8.7–10.3)
Chloride: 99 mmol/L (ref 97–108)
Creatinine, Ser: 0.73 mg/dL (ref 0.57–1.00)
GFR calc Af Amer: 98 mL/min/{1.73_m2} (ref >59)
GFR, EST NON AFRICAN AMERICAN: 85 mL/min/{1.73_m2} (ref >59)
Globulin, Total: 2.5 g/dL (ref 1.5–4.5)
Glucose: 94 mg/dL (ref 65–99)
Potassium: 4.1 mmol/L (ref 3.5–5.2)
Sodium: 140 mmol/L (ref 134–144)
TOTAL PROTEIN: 6.8 g/dL (ref 6.0–8.5)

## 2014-11-14 LAB — NMR, LIPOPROFILE
Cholesterol: 246 mg/dL — ABNORMAL HIGH (ref 100–199)
HDL Cholesterol by NMR: 72 mg/dL (ref >39)
HDL PARTICLE NUMBER: 39.6 umol/L (ref >=30.5)
LDL Particle Number: 1811 nmol/L — ABNORMAL HIGH (ref <1000)
LDL SIZE: 21.3 nm (ref >20.5)
LDL-C: 153 mg/dL — AB (ref 0–99)
LP-IR SCORE: 25 (ref <=45)
Small LDL Particle Number: 659 nmol/L — ABNORMAL HIGH (ref <=527)
Triglycerides by NMR: 103 mg/dL (ref 0–149)

## 2014-11-25 ENCOUNTER — Other Ambulatory Visit: Payer: Self-pay | Admitting: Nurse Practitioner

## 2014-11-25 ENCOUNTER — Telehealth: Payer: Self-pay | Admitting: Nurse Practitioner

## 2014-11-25 MED ORDER — ROSUVASTATIN CALCIUM 10 MG PO TABS: 10.0000 mg | ORAL_TABLET | Freq: Every day | ORAL | Status: DC

## 2014-11-25 NOTE — Telephone Encounter (Signed)
lmovm that crestor was sent to pharmacy

## 2014-11-25 NOTE — Telephone Encounter (Signed)
crestor rx sent to pharmacy 

## 2014-11-25 NOTE — Progress Notes (Signed)
Patient aware of labs. Wants crestor sent to [facility] in [place].

## 2014-11-26 ENCOUNTER — Encounter: Payer: Self-pay | Admitting: *Deleted

## 2015-03-04 ENCOUNTER — Ambulatory Visit (INDEPENDENT_AMBULATORY_CARE_PROVIDER_SITE_OTHER): Payer: Medicare Other

## 2015-03-04 ENCOUNTER — Ambulatory Visit (INDEPENDENT_AMBULATORY_CARE_PROVIDER_SITE_OTHER): Payer: Medicare Other | Admitting: Nurse Practitioner

## 2015-03-04 ENCOUNTER — Encounter (INDEPENDENT_AMBULATORY_CARE_PROVIDER_SITE_OTHER): Payer: Self-pay

## 2015-03-04 ENCOUNTER — Encounter: Payer: Self-pay | Admitting: Nurse Practitioner

## 2015-03-04 VITALS — BP 132/82 | HR 101 | Temp 97.4°F | Ht 63.0 in | Wt 155.0 lb

## 2015-03-04 DIAGNOSIS — Z23 Encounter for immunization: Secondary | ICD-10-CM

## 2015-03-04 DIAGNOSIS — Z6827 Body mass index (BMI) 27.0-27.9, adult: Secondary | ICD-10-CM

## 2015-03-04 DIAGNOSIS — E785 Hyperlipidemia, unspecified: Secondary | ICD-10-CM | POA: Diagnosis not present

## 2015-03-04 DIAGNOSIS — I10 Essential (primary) hypertension: Secondary | ICD-10-CM

## 2015-03-04 DIAGNOSIS — Z780 Asymptomatic menopausal state: Secondary | ICD-10-CM

## 2015-03-04 DIAGNOSIS — Z13820 Encounter for screening for osteoporosis: Secondary | ICD-10-CM

## 2015-03-04 MED ORDER — ROSUVASTATIN CALCIUM 10 MG PO TABS: 10.0000 mg | ORAL_TABLET | Freq: Every day | ORAL | Status: AC

## 2015-03-04 MED ORDER — AMLODIPINE BESYLATE 2.5 MG PO TABS: 2.5000 mg | ORAL_TABLET | Freq: Every day | ORAL | Status: AC

## 2015-03-04 MED ORDER — FOSINOPRIL SODIUM-HCTZ 20-12.5 MG PO TABS: 1.0000 | ORAL_TABLET | Freq: Two times a day (BID) | ORAL | Status: AC

## 2015-03-04 NOTE — Addendum Note (Signed)
Addended by: Cleda DaubUCKER, Tameeka Luo G on: 03/04/2015 04:59 PM   Modules accepted: Orders

## 2015-03-04 NOTE — Patient Instructions (Signed)

## 2015-03-04 NOTE — Addendum Note (Signed)
Addended by: Prescott GumLAND, Roxan Yamamoto M on: 03/04/2015 05:20 PM   Modules accepted: Orders

## 2015-03-04 NOTE — Progress Notes (Signed)
Subjective:    Patient ID: Carla Fleming, female    DOB: 13-Jun-1946, 69 y.o.   MRN: [id number]  Patient here today for follow up of chronic medical problems.  Hypertension This is a chronic problem. The current episode started more than 1 year ago. The problem is unchanged. The problem is uncontrolled. Pertinent negatives include no headaches, orthopnea, palpitations, peripheral edema or shortness of breath. Risk factors for coronary artery disease include dyslipidemia, post-menopausal state and sedentary lifestyle. Past treatments include ACE inhibitors and diuretics. The current treatment provides moderate improvement. Compliance problems include diet and exercise.  There is no history of CAD/MI, CVA or PVD.  Hyperlipidemia This is a chronic problem. The current episode started more than 1 year ago. The problem is uncontrolled. Recent lipid tests were reviewed and are variable. She has no history of diabetes, hypothyroidism or obesity. Factors aggravating her hyperlipidemia include thiazides. Pertinent negatives include no shortness of breath. Treatments tried: was suppose to strat on zocor at last visit but never got rx. The current treatment provides moderate improvement of lipids. Compliance problems include adherence to diet and adherence to exercise.  Risk factors for coronary artery disease include dyslipidemia, hypertension and post-menopausal.       Review of Systems  Constitutional: Negative.   HENT: Negative.   Respiratory: Negative for shortness of breath.   Cardiovascular: Negative for palpitations and orthopnea.  Genitourinary: Negative.   Neurological: Negative for headaches.  Psychiatric/Behavioral: Negative.   All other systems reviewed and are negative.      Objective:   Physical Exam  Constitutional: She is oriented to person, place, and time. She appears well-developed and well-nourished.  HENT:  Right Ear: Hearing, tympanic membrane, external ear and ear canal  normal.  Left Ear: Hearing, tympanic membrane, external ear and ear canal normal.  Nose: Mucosal edema and rhinorrhea present. Right sinus exhibits frontal sinus tenderness. Right sinus exhibits no maxillary sinus tenderness. Left sinus exhibits frontal sinus tenderness. Left sinus exhibits no maxillary sinus tenderness.  Mouth/Throat: Uvula is midline and oropharynx is clear and moist.  Eyes: EOM are normal.  Neck: Trachea normal, normal range of motion and full passive range of motion without pain. Neck supple. No JVD present. Carotid bruit is not present. No thyromegaly present.  Cardiovascular: Normal rate, regular rhythm, normal heart sounds and intact distal pulses.  Exam reveals no gallop and no friction rub.   No murmur heard. Pulmonary/Chest: Effort normal and breath sounds normal.  Abdominal: Soft. Bowel sounds are normal. She exhibits no distension and no mass. There is no tenderness.  Musculoskeletal: Normal range of motion. She exhibits no edema.  Lymphadenopathy:    She has no cervical adenopathy.  Neurological: She is alert and oriented to person, place, and time. She has normal reflexes.  Skin: Skin is warm and dry.  Psychiatric: She has a normal mood and affect. Her behavior is normal. Judgment and thought content normal.    BP 132/82 mmHg  Pulse 101  Temp(Src) 97.4 F (36.3 C) (Oral)  Ht  (1.6 m)  Wt 155 lb (70.308 kg)  BMI 27.46 kg/m2     Assessment & Plan:   1. Essential hypertension Do not add salt to diet - fosinopril-hydrochlorothiazide (MONOPRIL-HCT) 20-12.5 MG per tablet; Take 1 tablet by mouth 2 (two) times daily.  Dispense: 60 tablet; Refill: 1 - amLODipine (NORVASC) 2.5 MG tablet; Take 1 tablet (2.5 mg total) by mouth daily.  Dispense: 90 tablet; Refill: 3  2. Hyperlipidemia with target  LDL less than 100 Low fat diet - rosuvastatin (CRESTOR) 10 MG tablet; Take 1 tablet (10 mg total) by mouth daily.  Dispense: 90 tablet; Refill: 1  3. BMI  27.0-27.9,adult Discussed diet and exercise for person with BMI >25 Will recheck weight in 3-6 months     Labs pending Health maintenance reviewed Diet and exercise encouraged Continue all meds Follow up  In 3  months   Mary-Margaret Daphine DeutscherMartin, FNP

## 2015-03-05 ENCOUNTER — Telehealth: Payer: Self-pay | Admitting: *Deleted

## 2015-03-05 LAB — CMP14+EGFR
A/G RATIO: 1.8 (ref 1.1–2.5)
ALK PHOS: 91 IU/L (ref 39–117)
ALT: 14 IU/L (ref 0–32)
AST: 24 IU/L (ref 0–40)
Albumin: 4.4 g/dL (ref 3.6–4.8)
BILIRUBIN TOTAL: 0.5 mg/dL (ref 0.0–1.2)
BUN / CREAT RATIO: 22 (ref 11–26)
BUN: 15 mg/dL (ref 8–27)
CO2: 28 mmol/L (ref 18–29)
Calcium: 9.4 mg/dL (ref 8.7–10.3)
Chloride: 99 mmol/L (ref 97–108)
Creatinine, Ser: 0.69 mg/dL (ref 0.57–1.00)
GFR, EST AFRICAN AMERICAN: 103 mL/min/{1.73_m2} (ref >59)
GFR, EST NON AFRICAN AMERICAN: 90 mL/min/{1.73_m2} (ref >59)
Globulin, Total: 2.5 g/dL (ref 1.5–4.5)
Glucose: 92 mg/dL (ref 65–99)
POTASSIUM: 3.7 mmol/L (ref 3.5–5.2)
Sodium: 144 mmol/L (ref 134–144)
Total Protein: 6.9 g/dL (ref 6.0–8.5)

## 2015-03-05 LAB — LIPID PANEL
CHOLESTEROL TOTAL: 158 mg/dL (ref 100–199)
Chol/HDL Ratio: 2.5 ratio units (ref 0.0–4.4)
HDL: 62 mg/dL (ref >39)
LDL Calculated: 74 mg/dL (ref 0–99)
TRIGLYCERIDES: 110 mg/dL (ref 0–149)
VLDL CHOLESTEROL CAL: 22 mg/dL (ref 5–40)

## 2015-03-05 NOTE — Telephone Encounter (Signed)
-----   Message from [facility] PaMary-Margaret Martin, FNP sent at 03/05/2015 11:45 AM EDT ----- Kidney and liver function stable Cholesterol looks great Continue current meds- low fat diet and exercise and recheck in 3 months

## 2015-03-05 NOTE — Progress Notes (Signed)
Patient aware.

## 2015-03-08 ENCOUNTER — Ambulatory Visit: Payer: Medicare Other | Admitting: Nurse Practitioner

## 2015-04-08 ENCOUNTER — Ambulatory Visit: Payer: Medicare Other | Admitting: Family Medicine

## 2015-04-09 ENCOUNTER — Encounter (INDEPENDENT_AMBULATORY_CARE_PROVIDER_SITE_OTHER): Payer: Self-pay

## 2015-04-09 ENCOUNTER — Encounter: Payer: Self-pay | Admitting: Family Medicine

## 2015-04-09 ENCOUNTER — Ambulatory Visit (INDEPENDENT_AMBULATORY_CARE_PROVIDER_SITE_OTHER): Payer: Medicare Other

## 2015-04-09 ENCOUNTER — Ambulatory Visit (INDEPENDENT_AMBULATORY_CARE_PROVIDER_SITE_OTHER): Payer: Medicare Other | Admitting: Family Medicine

## 2015-04-09 VITALS — BP 138/85 | HR 87 | Temp 97.7°F | Ht 63.0 in | Wt 154.8 lb

## 2015-04-09 DIAGNOSIS — J069 Acute upper respiratory infection, unspecified: Secondary | ICD-10-CM

## 2015-04-09 DIAGNOSIS — R05 Cough: Secondary | ICD-10-CM | POA: Diagnosis not present

## 2015-04-09 DIAGNOSIS — J189 Pneumonia, unspecified organism: Secondary | ICD-10-CM | POA: Diagnosis not present

## 2015-04-09 DIAGNOSIS — R053 Chronic cough: Secondary | ICD-10-CM

## 2015-04-09 LAB — POCT INFLUENZA A/B
INFLUENZA A, POC: NEGATIVE
INFLUENZA B, POC: NEGATIVE

## 2015-04-09 LAB — POCT RAPID STREP A (OFFICE): RAPID STREP A SCREEN: NEGATIVE

## 2015-04-09 MED ORDER — TRIAMCINOLONE ACETONIDE 40 MG/ML IJ SUSP: 40.0000 mg | Freq: Once | INTRAMUSCULAR | Status: DC

## 2015-04-09 MED ORDER — DOXYCYCLINE HYCLATE 100 MG PO TABS: 100.0000 mg | ORAL_TABLET | Freq: Two times a day (BID) | ORAL | Status: AC

## 2015-04-09 MED ORDER — BENZONATATE 100 MG PO CAPS: 100.0000 mg | ORAL_CAPSULE | Freq: Two times a day (BID) | ORAL | Status: AC | PRN

## 2015-04-09 NOTE — Assessment & Plan Note (Addendum)
Initially I felt this to be viral, however there is blunting of the left costophrenic angle on her x-ray. Treat with doxycycline considering that it's Friday afternoon and I'm afraid I will get results beforefinishing for the day

## 2015-04-09 NOTE — Assessment & Plan Note (Signed)
Chronic productive cough for several months After long discussion with her anything that it's most likely postnasal drip Add nasal steroid to her daily Allegra Also consider ACE inhibitor induced cough, as this is not a chronic dry cough and she's been on this several years I think I would avoid that for now. Follow-up as needed for cough

## 2015-04-09 NOTE — Progress Notes (Signed)
Patient ID: Carla Fleming, female   DOB: [birth year], 69 y.o.   MRN: [id number]   HPI  Patient presents today for same-day appointment for cough  Patient explains that she's had cough congestion and chills for about a week. She's also developed right ear pain over the last 2 days. On further discussion she's also that malaise, myalgias, reduced appetite, and chills. She denies fevers. She does not have any dyspnea She denies chest pain She does have sore throat.  Chronic cough She doesHave a cough now for at least 2 months.She states that it is usually productive of green phlegm and is worse in the morning. She has changed from Zyrtec to Allegra without much change.She's tried nasacort before but not consistently.  PMH: Smoking status noted ROS: Per HPI  Objective: BP 138/85 mmHg  Pulse 87  Temp(Src) 97.7 F (36.5 C) (Oral)  Ht  (1.6 m)  Wt 154 lb 12.8 oz (70.217 kg)  BMI 27.43 kg/m2 Gen: NAD, alert, cooperative with exam HEENT: NCAT, nares clear, TMs WN L, oropharynx with slight erythema posteriorly no exudates Neck: No lymphadenopathy CV: RRR, good S1/S2, no murmur Resp: CTABL, no wheezes, non-labored Ext: No edema, warm Neuro: Alert and oriented, No gross deficits  Two-view chest x-ray 04/09/2015: Blunting of the left costophrenic angle which could be consistent with effusion   Assessment and plan:  CAP (community acquired pneumonia) Initially I felt this to be viral, however there is blunting of the left costophrenic angle on her x-ray. Treat with doxycycline considering that it's Friday afternoon and I'm afraid I will get results beforefinishing for the day    Chronic cough Chronic productive cough for several months After long discussion with her anything that it's most likely postnasal drip Add nasal steroid to her daily Allegra Also consider ACE inhibitor induced cough, as this is not a chronic dry cough and she's been on this several years I think I would  avoid that for now. Follow-up as needed for cough    Orders Placed This Encounter  Procedures  . Culture, Group A Strep  . DG Chest 2 View    Standing Status: Future     Number of Occurrences: 1     Standing Expiration Date: 06/08/2016    Order Specific Question:  Reason for Exam (SYMPTOM  OR DIAGNOSIS REQUIRED)    Answer:  cough, eval for CAP    Order Specific Question:  Preferred imaging location?    Answer:  Internal  . POCT rapid strep A  . POCT Influenza A/B    Meds ordered this encounter  Medications  . triamcinolone acetonide (KENALOG-40) injection 40 mg    Sig:   . doxycycline (VIBRA-TABS) 100 MG tablet    Sig: Take 1 tablet (100 mg total) by mouth 2 (two) times daily. 1 po bid    Dispense:  20 tablet    Refill:  0  . benzonatate (TESSALON) 100 MG capsule    Sig: Take 1 capsule (100 mg total) by mouth 2 (two) times daily as needed for cough.    Dispense:  20 capsule    Refill:  0    Murtis Sink, MD [facility] 04/09/2015, 2:37 PM

## 2015-04-09 NOTE — Patient Instructions (Signed)
Great to meet you!  Pneumonia Pneumonia is an infection of the lungs.  CAUSES Pneumonia may be caused by bacteria or a virus. Usually, these infections are caused by breathing infectious particles into the lungs (respiratory tract). SIGNS AND SYMPTOMS   Cough.  Fever.  Chest pain.  Increased rate of breathing.  Wheezing.  Mucus production. DIAGNOSIS  If you have the common symptoms of pneumonia, your health care provider will typically confirm the diagnosis with a chest X-ray. The X-ray will show an abnormality in the lung (pulmonary infiltrate) if you have pneumonia. Other tests of your blood, urine, or sputum may be done to find the specific cause of your pneumonia. Your health care provider may also do tests (blood gases or pulse oximetry) to see how well your lungs are working. TREATMENT  Some forms of pneumonia may be spread to other people when you cough or sneeze. You may be asked to wear a mask before and during your exam. Pneumonia that is caused by bacteria is treated with antibiotic medicine. Pneumonia that is caused by the influenza virus may be treated with an antiviral medicine. Most other viral infections must run their course. These infections will not respond to antibiotics.  HOME CARE INSTRUCTIONS   Cough suppressants may be used if you are losing too much rest. However, coughing protects you by clearing your lungs. You should avoid using cough suppressants if you can.  Your health care provider may have prescribed medicine if he or she thinks your pneumonia is caused by bacteria or influenza. Finish your medicine even if you start to feel better.  Your health care provider may also prescribe an expectorant. This loosens the mucus to be coughed up.  Take medicines only as directed by your health care provider.  Do not smoke. Smoking is a common cause of bronchitis and can contribute to pneumonia. If you are a smoker and continue to smoke, your cough may last several  weeks after your pneumonia has cleared.  A cold steam vaporizer or humidifier in your room or home may help loosen mucus.  Coughing is often worse at night. Sleeping in a semi-upright position in a recliner or using a couple pillows under your head will help with this.  Get rest as you feel it is needed. Your body will usually let you know when you need to rest. PREVENTION A pneumococcal shot (vaccine) is available to prevent a common bacterial cause of pneumonia. This is usually suggested for:  People over 80 years old.  Patients on chemotherapy.  People with chronic lung problems, such as bronchitis or emphysema.  People with immune system problems. If you are over 65 or have a high risk condition, you may receive the pneumococcal vaccine if you have not received it before. In some countries, a routine influenza vaccine is also recommended. This vaccine can help prevent some cases of pneumonia.You may be offered the influenza vaccine as part of your care. If you smoke, it is time to quit. You may receive instructions on how to stop smoking. Your health care provider can provide medicines and counseling to help you quit. SEEK MEDICAL CARE IF: You have a fever. SEEK IMMEDIATE MEDICAL CARE IF:   Your illness becomes worse. This is especially true if you are elderly or weakened from any other disease.  You cannot control your cough with suppressants and are losing sleep.  You begin coughing up blood.  You develop pain which is getting worse or is uncontrolled with medicines.  Any of the symptoms which initially brought you in for treatment are getting worse rather than better.  You develop shortness of breath or chest pain. MAKE SURE YOU:   Understand these instructions.  Will watch your condition.  Will get help right away if you are not doing well or get worse. Document Released: 08/14/2005 Document Revised: 12/29/2013 Document Reviewed: 11/03/2010 [facility], [facility], [facility] Patient  Information 2015 [place], [place]. This information is not intended to replace advice given to you by your health care provider. Make sure you discuss any questions you have with your health care provider.

## 2015-04-12 LAB — CULTURE, GROUP A STREP: Strep A Culture: NEGATIVE

## 2015-06-18 ENCOUNTER — Ambulatory Visit (INDEPENDENT_AMBULATORY_CARE_PROVIDER_SITE_OTHER): Payer: Medicare Other | Admitting: *Deleted

## 2015-06-18 DIAGNOSIS — Z23 Encounter for immunization: Secondary | ICD-10-CM | POA: Diagnosis not present

## 2015-07-01 ENCOUNTER — Encounter: Payer: Medicare Other | Admitting: Gynecology

## 2015-11-01 DIAGNOSIS — Z20828 Contact with and (suspected) exposure to other viral communicable diseases: Secondary | ICD-10-CM | POA: Diagnosis not present

## 2015-11-01 DIAGNOSIS — J029 Acute pharyngitis, unspecified: Secondary | ICD-10-CM | POA: Diagnosis not present

## 2016-06-26 ENCOUNTER — Ambulatory Visit (INDEPENDENT_AMBULATORY_CARE_PROVIDER_SITE_OTHER): Payer: Medicare HMO

## 2016-06-26 DIAGNOSIS — Z23 Encounter for immunization: Secondary | ICD-10-CM

## 2016-07-17 IMAGING — CR DG CHEST 2V
2 series · 2 of 2 positions shown · non-contrast
Comparison: 06/05/2014

CLINICAL DATA: Cough/congestion x1 week

EXAM:
CHEST  2 VIEW

[view not recorded (1 of 2)]
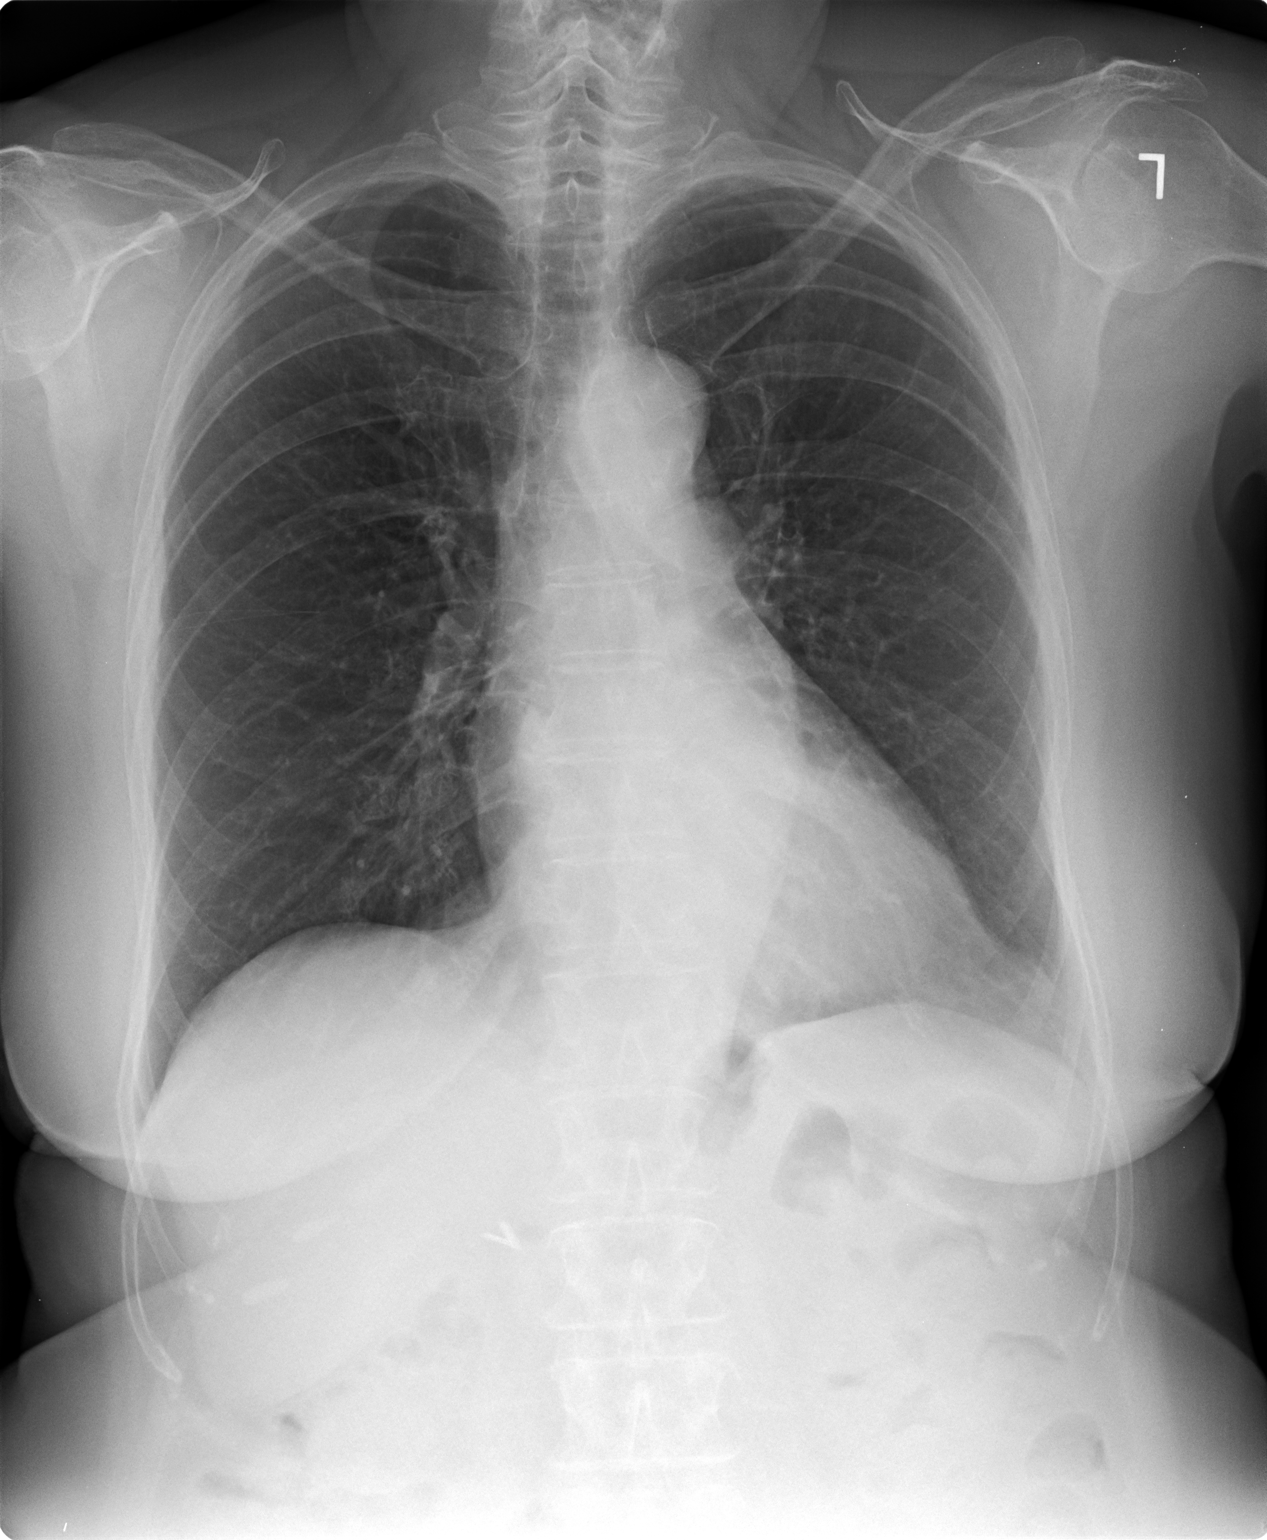

[view not recorded (2 of 2)]
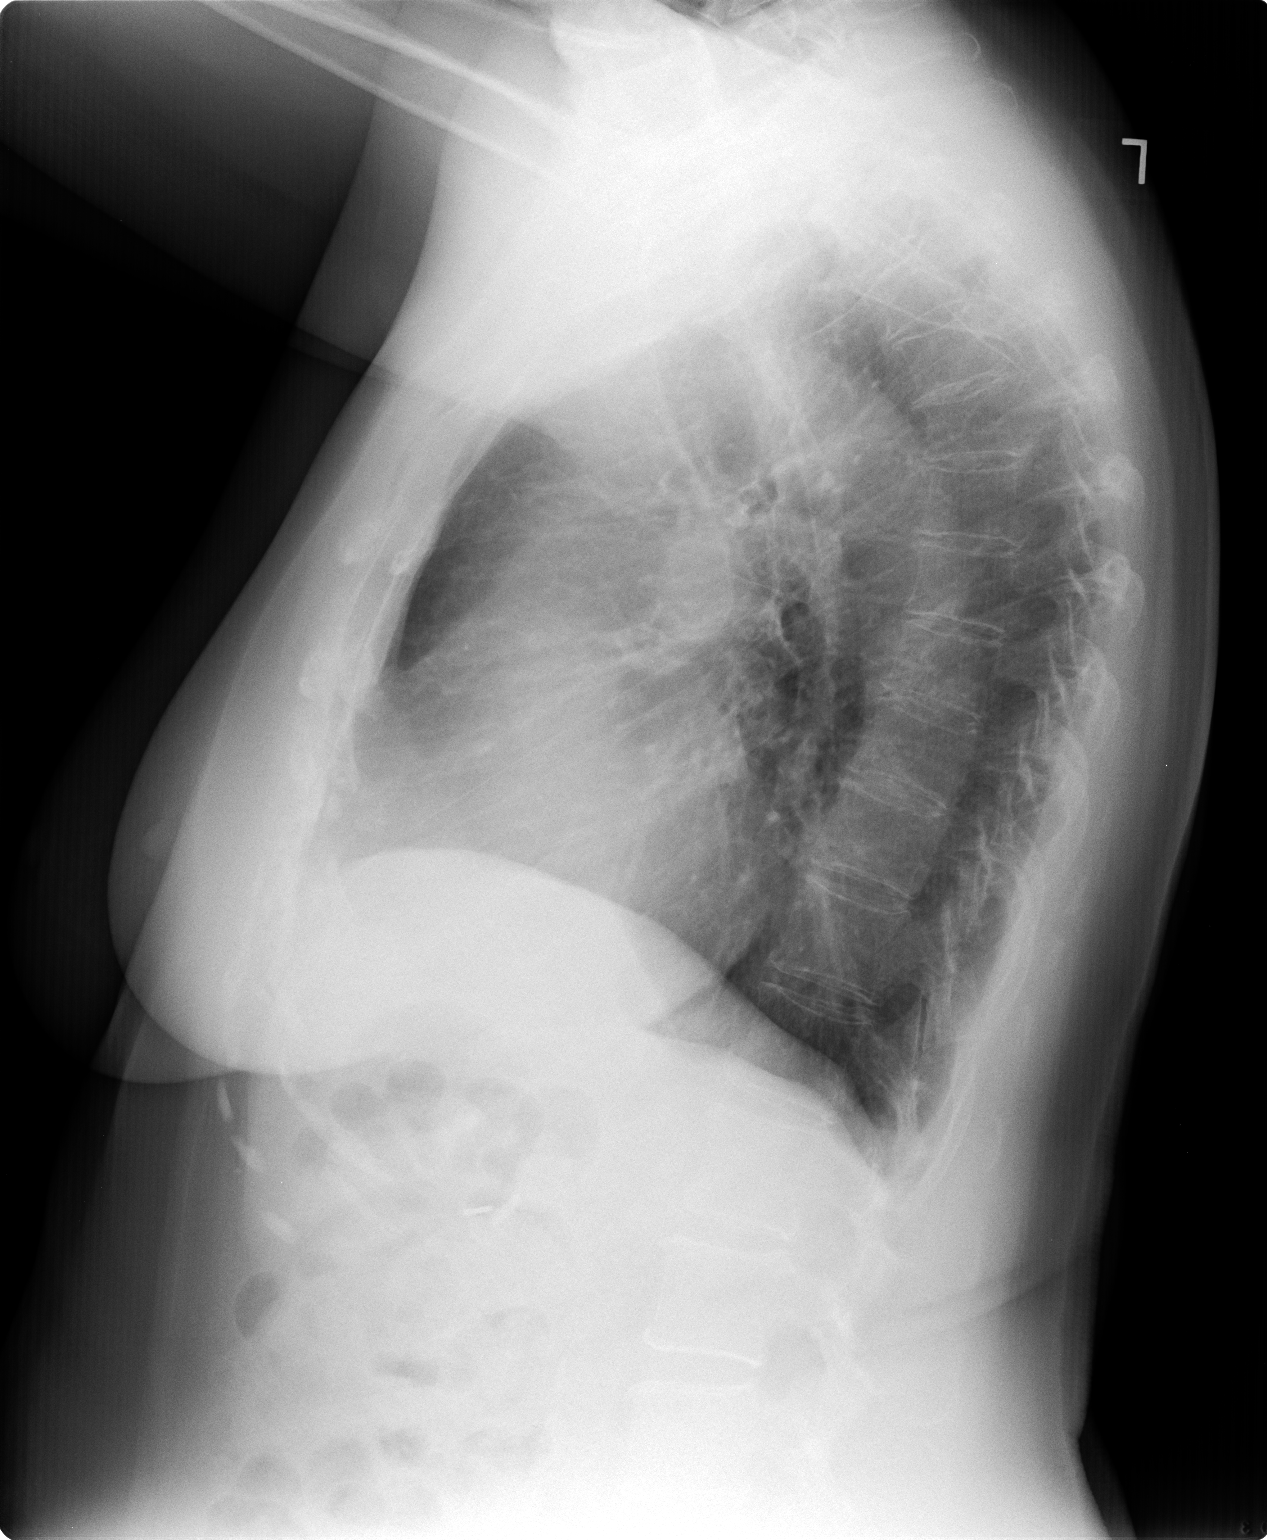

[2 of 2 positions shown; findings below may reference images not displayed]

FINDINGS: Lungs are clear.  No pleural effusion or pneumothorax.

The heart is normal in size.

Visualized osseous structures are within normal limits.

Cholecystectomy clips
IMPRESSION: No evidence of acute cardiopulmonary disease.

## 2017-01-10 ENCOUNTER — Encounter: Payer: Self-pay | Admitting: Gynecology

## 2017-07-04 ENCOUNTER — Ambulatory Visit (INDEPENDENT_AMBULATORY_CARE_PROVIDER_SITE_OTHER): Payer: Medicare HMO

## 2017-07-04 DIAGNOSIS — Z23 Encounter for immunization: Secondary | ICD-10-CM

## 2017-08-08 DIAGNOSIS — J329 Chronic sinusitis, unspecified: Secondary | ICD-10-CM | POA: Diagnosis not present

## 2017-11-04 DIAGNOSIS — I10 Essential (primary) hypertension: Secondary | ICD-10-CM | POA: Diagnosis not present

## 2017-11-04 DIAGNOSIS — R399 Unspecified symptoms and signs involving the genitourinary system: Secondary | ICD-10-CM | POA: Diagnosis not present

## 2017-11-04 DIAGNOSIS — I160 Hypertensive urgency: Secondary | ICD-10-CM | POA: Diagnosis not present

## 2018-07-02 ENCOUNTER — Ambulatory Visit (INDEPENDENT_AMBULATORY_CARE_PROVIDER_SITE_OTHER): Payer: Medicare HMO

## 2018-07-02 DIAGNOSIS — Z23 Encounter for immunization: Secondary | ICD-10-CM | POA: Diagnosis not present

## 2019-06-19 DIAGNOSIS — R69 Illness, unspecified: Secondary | ICD-10-CM | POA: Diagnosis not present

## 2019-07-28 ENCOUNTER — Other Ambulatory Visit: Payer: Self-pay | Admitting: Family Medicine

## 2019-07-28 DIAGNOSIS — Z1231 Encounter for screening mammogram for malignant neoplasm of breast: Secondary | ICD-10-CM

## 2019-12-23 DIAGNOSIS — H6983 Other specified disorders of Eustachian tube, bilateral: Secondary | ICD-10-CM | POA: Diagnosis not present

## 2019-12-23 DIAGNOSIS — J0100 Acute maxillary sinusitis, unspecified: Secondary | ICD-10-CM | POA: Diagnosis not present

## 2020-01-12 DIAGNOSIS — H6691 Otitis media, unspecified, right ear: Secondary | ICD-10-CM | POA: Diagnosis not present

## 2020-06-08 DIAGNOSIS — R69 Illness, unspecified: Secondary | ICD-10-CM | POA: Diagnosis not present

## 2020-08-09 DIAGNOSIS — H5213 Myopia, bilateral: Secondary | ICD-10-CM | POA: Diagnosis not present

## 2021-02-12 DIAGNOSIS — R350 Frequency of micturition: Secondary | ICD-10-CM | POA: Diagnosis not present

## 2021-02-12 DIAGNOSIS — N3000 Acute cystitis without hematuria: Secondary | ICD-10-CM | POA: Diagnosis not present

## 2021-03-21 ENCOUNTER — Encounter: Payer: Self-pay | Admitting: Gastroenterology

## 2021-08-11 DIAGNOSIS — H35362 Drusen (degenerative) of macula, left eye: Secondary | ICD-10-CM | POA: Diagnosis not present

## 2021-08-11 DIAGNOSIS — H524 Presbyopia: Secondary | ICD-10-CM | POA: Diagnosis not present

## 2021-10-19 DIAGNOSIS — H52213 Irregular astigmatism, bilateral: Secondary | ICD-10-CM | POA: Diagnosis not present

## 2021-10-19 DIAGNOSIS — H2511 Age-related nuclear cataract, right eye: Secondary | ICD-10-CM | POA: Diagnosis not present

## 2021-10-19 DIAGNOSIS — H25013 Cortical age-related cataract, bilateral: Secondary | ICD-10-CM | POA: Diagnosis not present

## 2021-10-19 DIAGNOSIS — H353132 Nonexudative age-related macular degeneration, bilateral, intermediate dry stage: Secondary | ICD-10-CM | POA: Diagnosis not present

## 2021-10-19 DIAGNOSIS — H35033 Hypertensive retinopathy, bilateral: Secondary | ICD-10-CM | POA: Diagnosis not present

## 2021-10-19 DIAGNOSIS — H524 Presbyopia: Secondary | ICD-10-CM | POA: Diagnosis not present

## 2021-10-19 DIAGNOSIS — H2513 Age-related nuclear cataract, bilateral: Secondary | ICD-10-CM | POA: Diagnosis not present

## 2021-10-27 DIAGNOSIS — J329 Chronic sinusitis, unspecified: Secondary | ICD-10-CM | POA: Diagnosis not present

## 2021-10-27 DIAGNOSIS — H10023 Other mucopurulent conjunctivitis, bilateral: Secondary | ICD-10-CM | POA: Diagnosis not present

## 2021-10-27 DIAGNOSIS — I10 Essential (primary) hypertension: Secondary | ICD-10-CM | POA: Diagnosis not present

## 2021-10-27 DIAGNOSIS — R69 Illness, unspecified: Secondary | ICD-10-CM | POA: Diagnosis not present

## 2021-12-13 DIAGNOSIS — H2511 Age-related nuclear cataract, right eye: Secondary | ICD-10-CM | POA: Diagnosis not present

## 2021-12-13 DIAGNOSIS — H25811 Combined forms of age-related cataract, right eye: Secondary | ICD-10-CM | POA: Diagnosis not present

## 2021-12-23 DIAGNOSIS — H2511 Age-related nuclear cataract, right eye: Secondary | ICD-10-CM | POA: Diagnosis not present

## 2022-01-04 DIAGNOSIS — H2512 Age-related nuclear cataract, left eye: Secondary | ICD-10-CM | POA: Diagnosis not present

## 2022-01-04 DIAGNOSIS — H25012 Cortical age-related cataract, left eye: Secondary | ICD-10-CM | POA: Diagnosis not present

## 2022-01-31 DIAGNOSIS — H25012 Cortical age-related cataract, left eye: Secondary | ICD-10-CM | POA: Diagnosis not present

## 2022-01-31 DIAGNOSIS — H25812 Combined forms of age-related cataract, left eye: Secondary | ICD-10-CM | POA: Diagnosis not present

## 2022-01-31 DIAGNOSIS — H2512 Age-related nuclear cataract, left eye: Secondary | ICD-10-CM | POA: Diagnosis not present

## 2022-02-10 DIAGNOSIS — H2512 Age-related nuclear cataract, left eye: Secondary | ICD-10-CM | POA: Diagnosis not present

## 2022-02-27 DIAGNOSIS — J0100 Acute maxillary sinusitis, unspecified: Secondary | ICD-10-CM | POA: Diagnosis not present

## 2022-02-27 DIAGNOSIS — H10231 Serous conjunctivitis, except viral, right eye: Secondary | ICD-10-CM | POA: Diagnosis not present

## 2022-05-09 DIAGNOSIS — H04123 Dry eye syndrome of bilateral lacrimal glands: Secondary | ICD-10-CM | POA: Diagnosis not present

## 2022-05-31 DIAGNOSIS — H04123 Dry eye syndrome of bilateral lacrimal glands: Secondary | ICD-10-CM | POA: Diagnosis not present

## 2022-08-30 DIAGNOSIS — H353132 Nonexudative age-related macular degeneration, bilateral, intermediate dry stage: Secondary | ICD-10-CM | POA: Diagnosis not present

## 2022-09-22 DIAGNOSIS — Z20822 Contact with and (suspected) exposure to covid-19: Secondary | ICD-10-CM | POA: Diagnosis not present

## 2022-10-13 DIAGNOSIS — J0100 Acute maxillary sinusitis, unspecified: Secondary | ICD-10-CM | POA: Diagnosis not present

## 2023-04-03 DIAGNOSIS — H524 Presbyopia: Secondary | ICD-10-CM | POA: Diagnosis not present

## 2023-04-03 DIAGNOSIS — H353132 Nonexudative age-related macular degeneration, bilateral, intermediate dry stage: Secondary | ICD-10-CM | POA: Diagnosis not present

## 2023-08-03 DIAGNOSIS — J0100 Acute maxillary sinusitis, unspecified: Secondary | ICD-10-CM | POA: Diagnosis not present

## 2023-08-03 DIAGNOSIS — H6993 Unspecified Eustachian tube disorder, bilateral: Secondary | ICD-10-CM | POA: Diagnosis not present

## 2023-10-04 DIAGNOSIS — H353132 Nonexudative age-related macular degeneration, bilateral, intermediate dry stage: Secondary | ICD-10-CM | POA: Diagnosis not present

## 2024-04-15 DIAGNOSIS — H353112 Nonexudative age-related macular degeneration, right eye, intermediate dry stage: Secondary | ICD-10-CM | POA: Diagnosis not present

## 2024-04-15 DIAGNOSIS — H524 Presbyopia: Secondary | ICD-10-CM | POA: Diagnosis not present

## 2024-05-05 DIAGNOSIS — H353132 Nonexudative age-related macular degeneration, bilateral, intermediate dry stage: Secondary | ICD-10-CM | POA: Diagnosis not present

## 2024-05-05 DIAGNOSIS — Z961 Presence of intraocular lens: Secondary | ICD-10-CM | POA: Diagnosis not present

## 2024-05-05 DIAGNOSIS — H26493 Other secondary cataract, bilateral: Secondary | ICD-10-CM | POA: Diagnosis not present

## 2024-05-16 DIAGNOSIS — B9689 Other specified bacterial agents as the cause of diseases classified elsewhere: Secondary | ICD-10-CM | POA: Diagnosis not present

## 2024-05-16 DIAGNOSIS — T2017XA Burn of first degree of neck, initial encounter: Secondary | ICD-10-CM | POA: Diagnosis not present

## 2024-05-16 DIAGNOSIS — J019 Acute sinusitis, unspecified: Secondary | ICD-10-CM | POA: Diagnosis not present

## 2024-05-27 DIAGNOSIS — H26493 Other secondary cataract, bilateral: Secondary | ICD-10-CM | POA: Diagnosis not present
